# Patient Record
Sex: Male | Born: 1945 | Race: White | Hispanic: No | Marital: Married | Smoking: Never smoker
Health system: Southern US, Community
[De-identification: ages and names within clinical notes are randomized; demographics above are authoritative.]

## PROBLEM LIST (undated history)

## (undated) DIAGNOSIS — R51 Headache: Secondary | ICD-10-CM

## (undated) DIAGNOSIS — K219 Gastro-esophageal reflux disease without esophagitis: Secondary | ICD-10-CM

## (undated) DIAGNOSIS — J189 Pneumonia, unspecified organism: Secondary | ICD-10-CM

## (undated) DIAGNOSIS — R519 Headache, unspecified: Secondary | ICD-10-CM

## (undated) DIAGNOSIS — F419 Anxiety disorder, unspecified: Secondary | ICD-10-CM

## (undated) DIAGNOSIS — J45909 Unspecified asthma, uncomplicated: Secondary | ICD-10-CM

## (undated) DIAGNOSIS — I2510 Atherosclerotic heart disease of native coronary artery without angina pectoris: Secondary | ICD-10-CM

## (undated) DIAGNOSIS — E781 Pure hyperglyceridemia: Secondary | ICD-10-CM

## (undated) DIAGNOSIS — I219 Acute myocardial infarction, unspecified: Secondary | ICD-10-CM

## (undated) DIAGNOSIS — C801 Malignant (primary) neoplasm, unspecified: Secondary | ICD-10-CM

## (undated) HISTORY — PX: COLON SURGERY: SHX602

## (undated) HISTORY — PX: TONSILLECTOMY: SUR1361

## (undated) HISTORY — PX: CORONARY ANGIOPLASTY WITH STENT PLACEMENT: SHX49

## (undated) HISTORY — PX: APPENDECTOMY: SHX54

## (undated) HISTORY — PX: CHOLECYSTECTOMY: SHX55

---

## 2011-03-20 DIAGNOSIS — J4530 Mild persistent asthma, uncomplicated: Secondary | ICD-10-CM | POA: Insufficient documentation

## 2011-03-20 DIAGNOSIS — J309 Allergic rhinitis, unspecified: Secondary | ICD-10-CM | POA: Insufficient documentation

## 2011-03-20 DIAGNOSIS — J45909 Unspecified asthma, uncomplicated: Secondary | ICD-10-CM | POA: Insufficient documentation

## 2014-03-16 ENCOUNTER — Encounter: Payer: Self-pay | Admitting: Internal Medicine

## 2014-09-15 ENCOUNTER — Emergency Department (HOSPITAL_BASED_OUTPATIENT_CLINIC_OR_DEPARTMENT_OTHER)
Admission: EM | Admit: 2014-09-15 | Discharge: 2014-09-15 | Disposition: A | Discharge status: Home / Self Care | Payer: Medicare Other | Attending: Emergency Medicine | Admitting: Emergency Medicine

## 2014-09-15 ENCOUNTER — Encounter (HOSPITAL_BASED_OUTPATIENT_CLINIC_OR_DEPARTMENT_OTHER): Payer: Self-pay | Admitting: *Deleted

## 2014-09-15 ENCOUNTER — Emergency Department (HOSPITAL_BASED_OUTPATIENT_CLINIC_OR_DEPARTMENT_OTHER): Payer: Medicare Other

## 2014-09-15 DIAGNOSIS — I2510 Atherosclerotic heart disease of native coronary artery without angina pectoris: Secondary | ICD-10-CM | POA: Diagnosis not present

## 2014-09-15 DIAGNOSIS — Z8639 Personal history of other endocrine, nutritional and metabolic disease: Secondary | ICD-10-CM | POA: Diagnosis not present

## 2014-09-15 DIAGNOSIS — Z7982 Long term (current) use of aspirin: Secondary | ICD-10-CM | POA: Diagnosis not present

## 2014-09-15 DIAGNOSIS — Z79899 Other long term (current) drug therapy: Secondary | ICD-10-CM | POA: Insufficient documentation

## 2014-09-15 DIAGNOSIS — Z9861 Coronary angioplasty status: Secondary | ICD-10-CM | POA: Diagnosis not present

## 2014-09-15 DIAGNOSIS — R05 Cough: Secondary | ICD-10-CM | POA: Diagnosis present

## 2014-09-15 DIAGNOSIS — J302 Other seasonal allergic rhinitis: Secondary | ICD-10-CM

## 2014-09-15 DIAGNOSIS — J4520 Mild intermittent asthma, uncomplicated: Secondary | ICD-10-CM | POA: Insufficient documentation

## 2014-09-15 DIAGNOSIS — F419 Anxiety disorder, unspecified: Secondary | ICD-10-CM | POA: Diagnosis not present

## 2014-09-15 DIAGNOSIS — Z880 Allergy status to penicillin: Secondary | ICD-10-CM | POA: Insufficient documentation

## 2014-09-15 DIAGNOSIS — R059 Cough, unspecified: Secondary | ICD-10-CM

## 2014-09-15 HISTORY — DX: Pure hyperglyceridemia: E78.1

## 2014-09-15 HISTORY — DX: Anxiety disorder, unspecified: F41.9

## 2014-09-15 HISTORY — DX: Atherosclerotic heart disease of native coronary artery without angina pectoris: I25.10

## 2014-09-15 MED ORDER — PREDNISONE 20 MG PO TABS: 40.0000 mg | ORAL_TABLET | Freq: Every day | ORAL | Status: DC

## 2014-09-15 MED ORDER — ALBUTEROL SULFATE HFA 108 (90 BASE) MCG/ACT IN AERS
2.0000 | INHALATION_SPRAY | RESPIRATORY_TRACT | Status: DC | PRN
Start: 2014-09-15 — End: 2014-09-16
  Administered 2014-09-15: 2 via RESPIRATORY_TRACT
  Filled 2014-09-15: qty 6.7

## 2014-09-15 NOTE — ED Notes (Addendum)
EDP at [facility], pt seen by Dr. Betsey Holiday prior to RN assessment, see MD notes, pending orders. Pt sitting in chair, alert, NAD, calm, interactive, no dyspnea noted.

## 2014-09-15 NOTE — ED Provider Notes (Signed)
CSN: [id number]     Arrival date & time 09/15/14  2143 History  This chart was scribed for Isaac Greek, MD by Peyton Bottoms, ED Scribe. This patient was seen in room MH07/MH07 and the patient's care was started at 10:34 PM.   Chief Complaint  Patient presents with  . Cough   Patient is a 69 y.o. male presenting with cough. The history is provided by the patient. No language interpreter was used.  Cough Severity:  Moderate Onset quality:  Gradual Timing:  Constant Progression:  Waxing and waning Chronicity:  New Associated symptoms: shortness of breath    HPI Comments: Kellin Bartling is a 69 y.o. male with a PMHx of CAD, high triglycerides and anxiety, who presents to the Emergency Department complaining of moderate cough that began earlier today. Patient states that he took 2 albuterol treatments today. He states that the first treatment helped with some relief. He states that the albuterol medication was out dated. He states he had some "weird symptoms" after the second treatment. Patient also reports hx of tree allergies and is not sure what is causing his symptoms of cough and trouble breathing.  Past Medical History  Diagnosis Date  . Coronary artery disease   . High triglycerides   . Anxiety    Past Surgical History  Procedure Laterality Date  . Colon surgery    . Cholecystectomy    . Appendectomy    . Tonsillectomy    . Coronary angioplasty with stent placement     No family history on file. History  Substance Use Topics  . Smoking status: Never Smoker   . Smokeless tobacco: Not on file  . Alcohol Use: Yes     Comment: monthly   Review of Systems  Respiratory: Positive for cough and shortness of breath.   All other systems reviewed and are negative.  Allergies  Penicillins  Home Medications   Prior to Admission medications   Medication Sig Start Date End Date Taking? Authorizing Provider  albuterol (PROVENTIL HFA;VENTOLIN HFA) 108 (90 BASE) MCG/ACT  inhaler Inhale into the lungs every 6 (six) hours as needed for wheezing or shortness of breath.   Yes Historical Provider, MD  aspirin 81 MG tablet Take 81 mg by mouth daily.   Yes Historical Provider, MD  atorvastatin (LIPITOR) 20 MG tablet Take 20 mg by mouth daily.   Yes Historical Provider, MD  Choline Fenofibrate (TRILIPIX PO) Take by mouth.   Yes Historical Provider, MD  flunisolide (NASAREL) 29 MCG/ACT (0.025%) nasal spray Place 2 sprays into the nose 2 (two) times daily. Dose is for each nostril.   Yes Historical Provider, MD  IPRATROPIUM BROMIDE IN Inhale into the lungs.   Yes Historical Provider, MD  METOPROLOL SUCCINATE ER PO Take by mouth.   Yes Historical Provider, MD  montelukast (SINGULAIR) 10 MG tablet Take 10 mg by mouth at bedtime.   Yes Historical Provider, MD  PARoxetine HCl (PAXIL PO) Take by mouth.   Yes Historical Provider, MD   There were no vitals taken for this visit. Physical Exam  Constitutional: He is oriented to person, place, and time. He appears well-developed and well-nourished. No distress.  HENT:  Head: Normocephalic and atraumatic.  Right Ear: Hearing normal.  Left Ear: Hearing normal.  Nose: Nose normal.  Mouth/Throat: Oropharynx is clear and moist and mucous membranes are normal.  Eyes: Conjunctivae and EOM are normal. Pupils are equal, round, and reactive to light.  Neck: Normal range of motion. Neck  supple.  Cardiovascular: Regular rhythm, S1 normal and S2 normal.  Exam reveals no gallop and no friction rub.   No murmur heard. Pulmonary/Chest: Effort normal and breath sounds normal. No respiratory distress. He exhibits no tenderness.  Abdominal: Soft. Normal appearance and bowel sounds are normal. There is no hepatosplenomegaly. There is no tenderness. There is no rebound, no guarding, no tenderness at McBurney's point and negative Murphy's sign. No hernia.  Musculoskeletal: Normal range of motion.  Neurological: He is alert and oriented to person,  place, and time. He has normal strength. No cranial nerve deficit or sensory deficit. Coordination normal. GCS eye subscore is 4. GCS verbal subscore is 5. GCS motor subscore is 6.  Skin: Skin is warm, dry and intact. No rash noted. No cyanosis.  Psychiatric: He has a normal mood and affect. His speech is normal and behavior is normal. Thought content normal.  Nursing note and vitals reviewed.  ED Course  Procedures (including critical care time)  DIAGNOSTIC STUDIES:   COORDINATION OF CARE: 10:37 PM- Discussed plans to order diagnostic CXR. Pt advised of plan for treatment and pt agrees.  Labs Review Labs Reviewed - No data to display  Imaging Review No results found.   EKG Interpretation None     MDM   Final diagnoses:  None  seasonal allergies Asthma  Complains of mild throat irritation, soreness across the sinuses with cough. Patient does have a history of allergies. Patient reports using albuterol earlier, then noticed that it was out of date. He became concerned that using the expired albuterol would harm him. Examination is unremarkable. He is moving air well. Chest x-ray performed, no evidence of pneumonia. Will treat for seasonal allergies and mild asthma without any significant bronchospasm.  I personally performed the services described in this documentation, which was scribed in my presence. The recorded information has been reviewed and is accurate.    Isaac Greek, MD 09/15/14 [phone]

## 2014-09-15 NOTE — ED Notes (Signed)
C/o cough, also mentions some mild throat irritation and forehead sinus soreness, (denies: earache, facial pain, sore throat, known fever, pain, sob, nvd, dizziness or other sx), attributes sx to h/o Asthma and allergies. Has used the last of his expired inhaler. Cough somewhat productive intermitantly (scant clear and some yellow). Mentions some chills.

## 2014-09-15 NOTE — Discharge Instructions (Signed)
Asthma Asthma is a recurring condition in which the airways tighten and narrow. Asthma can make it difficult to breathe. It can cause coughing, wheezing, and shortness of breath. Asthma episodes, also called asthma attacks, range from minor to life-threatening. Asthma cannot be cured, but medicines and lifestyle changes can help control it. CAUSES Asthma is believed to be caused by inherited (genetic) and environmental factors, but its exact cause is unknown. Asthma may be triggered by allergens, lung infections, or irritants in the air. Asthma triggers are different for each person. Common triggers include:   Animal dander.  Dust mites.  Cockroaches.  Pollen from trees or grass.  Mold.  Smoke.  Air pollutants such as dust, household cleaners, hair sprays, aerosol sprays, paint fumes, strong chemicals, or strong odors.  Cold air, weather changes, and winds (which increase molds and pollens in the air).  Strong emotional expressions such as crying or laughing hard.  Stress.  Certain medicines (such as aspirin) or types of drugs (such as beta-blockers).  Sulfites in foods and drinks. Foods and drinks that may contain sulfites include dried fruit, potato chips, and sparkling grape juice.  Infections or inflammatory conditions such as the flu, a cold, or an inflammation of the nasal membranes (rhinitis).  Gastroesophageal reflux disease (GERD).  Exercise or strenuous activity. SYMPTOMS Symptoms may occur immediately after asthma is triggered or many hours later. Symptoms include:  Wheezing.  Excessive nighttime or early morning coughing.  Frequent or severe coughing with a common cold.  Chest tightness.  Shortness of breath. DIAGNOSIS  The diagnosis of asthma is made by a review of your medical history and a physical exam. Tests may also be performed. These may include:  Lung function studies. These tests show how much air you breathe in and out.  Allergy  tests.  Imaging tests such as X-rays. TREATMENT  Asthma cannot be cured, but it can usually be controlled. Treatment involves identifying and avoiding your asthma triggers. It also involves medicines. There are 2 classes of medicine used for asthma treatment:   Controller medicines. These prevent asthma symptoms from occurring. They are usually taken every day.  Reliever or rescue medicines. These quickly relieve asthma symptoms. They are used as needed and provide short-term relief. Your health care provider will help you create an asthma action plan. An asthma action plan is a written plan for managing and treating your asthma attacks. It includes a list of your asthma triggers and how they may be avoided. It also includes information on when medicines should be taken and when their dosage should be changed. An action plan may also involve the use of a device called a peak flow meter. A peak flow meter measures how well the lungs are working. It helps you monitor your condition. HOME CARE INSTRUCTIONS   Take medicines only as directed by your health care provider. Speak with your health care provider if you have questions about how or when to take the medicines.  Use a peak flow meter as directed by your health care provider. Record and keep track of readings.  Understand and use the action plan to help minimize or stop an asthma attack without needing to seek medical care.  Control your home environment in the following ways to help prevent asthma attacks:  Do not smoke. Avoid being exposed to secondhand smoke.  Change your heating and air conditioning filter regularly.  Limit your use of fireplaces and wood stoves.  Get rid of pests (such as roaches and  mice) and their droppings.  Throw away plants if you see mold on them.  Clean your floors and dust regularly. Use unscented cleaning products.  Try to have someone else vacuum for you regularly. Stay out of rooms while they are  being vacuumed and for a short while afterward. If you vacuum, use a dust mask from a hardware store, a double-layered or microfilter vacuum cleaner bag, or a vacuum cleaner with a HEPA filter.  Replace carpet with wood, tile, or vinyl flooring. Carpet can trap dander and dust.  Use allergy-proof pillows, mattress covers, and box spring covers.  Wash bed sheets and blankets every week in hot water and dry them in a dryer.  Use blankets that are made of polyester or cotton.  Clean bathrooms and kitchens with bleach. If possible, have someone repaint the walls in these rooms with mold-resistant paint. Keep out of the rooms that are being cleaned and painted.  Wash hands frequently. SEEK MEDICAL CARE IF:   You have wheezing, shortness of breath, or a cough even if taking medicine to prevent attacks.  The colored mucus you cough up (sputum) is thicker than usual.  Your sputum changes from clear or white to yellow, green, gray, or bloody.  You have any problems that may be related to the medicines you are taking (such as a rash, itching, swelling, or trouble breathing).  You are using a reliever medicine more than 2-3 times per week.  Your peak flow is still at 50-79% of your personal best after following your action plan for 1 hour.  You have a fever. SEEK IMMEDIATE MEDICAL CARE IF:   You seem to be getting worse and are unresponsive to treatment during an asthma attack.  You are short of breath even at rest.  You get short of breath when doing very little physical activity.  You have difficulty eating, drinking, or talking due to asthma symptoms.  You develop chest pain.  You develop a fast heartbeat.  You have a bluish color to your lips or fingernails.  You are light-headed, dizzy, or faint.  Your peak flow is less than 50% of your personal best. MAKE SURE YOU:   Understand these instructions.  Will watch your condition.  Will get help right away if you are not  doing well or get worse. Document Released: 06/10/2005 Document Revised: 10/25/2013 Document Reviewed: 01/07/2013 [facility] Patient Information 2015 [place], [place]. This information is not intended to replace advice given to you by your health care provider. Make sure you discuss any questions you have with your health care provider.  Hay Fever Hay fever is an allergic reaction to particles in the air. It cannot be passed from person to person. It cannot be cured, but it can be controlled. CAUSES  Hay fever is caused by something that triggers an allergic reaction (allergens). The following are examples of allergens:  Ragweed.  Feathers.  Animal dander.  Grass and tree pollens.  Cigarette smoke.  House dust.  Pollution. SYMPTOMS   Sneezing.  Runny or stuffy nose.  Tearing eyes.  Itchy eyes, nose, mouth, throat, skin, or other area.  Sore throat.  Headache.  Decreased sense of smell or taste. DIAGNOSIS Your caregiver will perform a physical exam and ask questions about the symptoms you are having.Allergy testing may be done to determine exactly what triggers your hay fever.  TREATMENT   Over-the-counter medicines may help symptoms. These include:  Antihistamines.  Decongestants. These may help with nasal congestion.  Your  caregiver may prescribe medicines if over-the-counter medicines do not work.  Some people benefit from allergy shots when other medicines are not helpful. HOME CARE INSTRUCTIONS   Avoid the allergen that is causing your symptoms, if possible.  Take all medicine as told by your caregiver. SEEK MEDICAL CARE IF:   You have severe allergy symptoms and your current medicines are not helping.  Your treatment was working at one time, but you are now experiencing symptoms.  You have sinus congestion and pressure.  You develop a fever or headache.  You have thick nasal discharge.  You have asthma and have a worsening cough and  wheezing. SEEK IMMEDIATE MEDICAL CARE IF:   You have swelling of your tongue or lips.  You have trouble breathing.  You feel lightheaded or like you are going to faint.  You have cold sweats.  You have a fever. Document Released: 06/10/2005 Document Revised: 09/02/2011 Document Reviewed: 09/05/2010 [facility] Patient Information 2015 [place], [place]. This information is not intended to replace advice given to you by your health care provider. Make sure you discuss any questions you have with your health care provider.

## 2014-09-15 NOTE — ED Notes (Signed)
Cough today. States his tree allergies are acting up. He used an outdated Albuterol inhaler with no improvement in the cough.

## 2015-02-16 DIAGNOSIS — T7800XD Anaphylactic reaction due to unspecified food, subsequent encounter: Secondary | ICD-10-CM | POA: Insufficient documentation

## 2015-02-16 DIAGNOSIS — J301 Allergic rhinitis due to pollen: Secondary | ICD-10-CM

## 2015-04-05 ENCOUNTER — Other Ambulatory Visit: Payer: Self-pay

## 2015-04-05 ENCOUNTER — Ambulatory Visit (INDEPENDENT_AMBULATORY_CARE_PROVIDER_SITE_OTHER): Payer: Medicare Other

## 2015-04-05 DIAGNOSIS — J309 Allergic rhinitis, unspecified: Secondary | ICD-10-CM | POA: Diagnosis not present

## 2015-04-05 DIAGNOSIS — J4540 Moderate persistent asthma, uncomplicated: Secondary | ICD-10-CM

## 2015-04-05 MED ORDER — MONTELUKAST SODIUM 10 MG PO TABS: 10.0000 mg | ORAL_TABLET | Freq: Every day | ORAL | Status: DC

## 2015-04-26 ENCOUNTER — Ambulatory Visit (INDEPENDENT_AMBULATORY_CARE_PROVIDER_SITE_OTHER): Payer: Medicare Other

## 2015-04-26 DIAGNOSIS — J309 Allergic rhinitis, unspecified: Secondary | ICD-10-CM | POA: Diagnosis not present

## 2015-05-15 DIAGNOSIS — I2510 Atherosclerotic heart disease of native coronary artery without angina pectoris: Secondary | ICD-10-CM | POA: Insufficient documentation

## 2015-05-15 DIAGNOSIS — E782 Mixed hyperlipidemia: Secondary | ICD-10-CM | POA: Insufficient documentation

## 2015-05-16 ENCOUNTER — Ambulatory Visit (INDEPENDENT_AMBULATORY_CARE_PROVIDER_SITE_OTHER): Payer: Medicare Other | Admitting: *Deleted

## 2015-05-16 DIAGNOSIS — J309 Allergic rhinitis, unspecified: Secondary | ICD-10-CM

## 2015-05-25 ENCOUNTER — Ambulatory Visit (INDEPENDENT_AMBULATORY_CARE_PROVIDER_SITE_OTHER): Payer: Medicare Other | Admitting: *Deleted

## 2015-05-25 DIAGNOSIS — J309 Allergic rhinitis, unspecified: Secondary | ICD-10-CM | POA: Diagnosis not present

## 2015-06-07 ENCOUNTER — Ambulatory Visit (INDEPENDENT_AMBULATORY_CARE_PROVIDER_SITE_OTHER): Payer: Medicare Other | Admitting: *Deleted

## 2015-06-07 DIAGNOSIS — J309 Allergic rhinitis, unspecified: Secondary | ICD-10-CM | POA: Diagnosis not present

## 2015-06-12 ENCOUNTER — Other Ambulatory Visit: Payer: Self-pay | Admitting: Internal Medicine

## 2015-06-14 ENCOUNTER — Ambulatory Visit (INDEPENDENT_AMBULATORY_CARE_PROVIDER_SITE_OTHER): Payer: Medicare Other | Admitting: *Deleted

## 2015-06-14 DIAGNOSIS — J309 Allergic rhinitis, unspecified: Secondary | ICD-10-CM | POA: Diagnosis not present

## 2015-06-21 ENCOUNTER — Ambulatory Visit (INDEPENDENT_AMBULATORY_CARE_PROVIDER_SITE_OTHER): Payer: Medicare Other

## 2015-06-21 DIAGNOSIS — J309 Allergic rhinitis, unspecified: Secondary | ICD-10-CM | POA: Diagnosis not present

## 2015-06-28 ENCOUNTER — Ambulatory Visit (INDEPENDENT_AMBULATORY_CARE_PROVIDER_SITE_OTHER): Payer: Medicare Other

## 2015-06-28 DIAGNOSIS — J309 Allergic rhinitis, unspecified: Secondary | ICD-10-CM | POA: Diagnosis not present

## 2015-07-18 ENCOUNTER — Ambulatory Visit (INDEPENDENT_AMBULATORY_CARE_PROVIDER_SITE_OTHER): Payer: Medicare Other

## 2015-07-18 DIAGNOSIS — J309 Allergic rhinitis, unspecified: Secondary | ICD-10-CM | POA: Diagnosis not present

## 2015-07-20 DIAGNOSIS — J301 Allergic rhinitis due to pollen: Secondary | ICD-10-CM | POA: Diagnosis not present

## 2015-07-21 DIAGNOSIS — J3089 Other allergic rhinitis: Secondary | ICD-10-CM | POA: Diagnosis not present

## 2015-08-09 ENCOUNTER — Ambulatory Visit (INDEPENDENT_AMBULATORY_CARE_PROVIDER_SITE_OTHER): Payer: Medicare Other | Admitting: *Deleted

## 2015-08-09 DIAGNOSIS — J309 Allergic rhinitis, unspecified: Secondary | ICD-10-CM

## 2015-08-30 ENCOUNTER — Ambulatory Visit (INDEPENDENT_AMBULATORY_CARE_PROVIDER_SITE_OTHER): Payer: Medicare Other | Admitting: *Deleted

## 2015-08-30 DIAGNOSIS — J309 Allergic rhinitis, unspecified: Secondary | ICD-10-CM

## 2015-09-20 ENCOUNTER — Ambulatory Visit (INDEPENDENT_AMBULATORY_CARE_PROVIDER_SITE_OTHER): Payer: Medicare Other

## 2015-09-20 DIAGNOSIS — J309 Allergic rhinitis, unspecified: Secondary | ICD-10-CM

## 2015-09-22 ENCOUNTER — Ambulatory Visit (INDEPENDENT_AMBULATORY_CARE_PROVIDER_SITE_OTHER): Payer: Medicare Other | Admitting: Internal Medicine

## 2015-09-22 ENCOUNTER — Encounter: Payer: Self-pay | Admitting: Internal Medicine

## 2015-09-22 DIAGNOSIS — Z955 Presence of coronary angioplasty implant and graft: Secondary | ICD-10-CM | POA: Insufficient documentation

## 2015-09-22 DIAGNOSIS — J4531 Mild persistent asthma with (acute) exacerbation: Secondary | ICD-10-CM | POA: Diagnosis not present

## 2015-09-22 DIAGNOSIS — J3089 Other allergic rhinitis: Secondary | ICD-10-CM

## 2015-09-22 DIAGNOSIS — T7800XD Anaphylactic reaction due to unspecified food, subsequent encounter: Secondary | ICD-10-CM

## 2015-09-22 MED ORDER — MONTELUKAST SODIUM 10 MG PO TABS
10.0000 mg | ORAL_TABLET | Freq: Every day | ORAL | Status: DC
Start: 2015-09-22 — End: 2016-09-04

## 2015-09-22 MED ORDER — CETIRIZINE HCL 10 MG PO TABS: 10.0000 mg | ORAL_TABLET | Freq: Every day | ORAL | Status: DC

## 2015-09-22 MED ORDER — ALBUTEROL SULFATE HFA 108 (90 BASE) MCG/ACT IN AERS: 2.0000 | INHALATION_SPRAY | RESPIRATORY_TRACT | Status: AC | PRN

## 2015-09-22 NOTE — Patient Instructions (Signed)
Allergic rhinitis  Currently not well controlled due to springtime allergens  Start cetirizine (Zyrtec) 10 mg daily  Continue montelukast (Singulair) 10 mg daily  Increase flunisolide to 1 spray each nostril twice a day  Continue ipratropium nasal spray as needed  Mild persistent asthma  Currently not well controlled due to springtime allergens Given Prednisone 10 mg tablets. Take 1 tablet twice a day for 4 days, then 1 tablet on day #5. Continue Singulair 10 milligrams daily Continue as needed albuterol (pro-air) He will let me know if symptoms do not improve following the prednisone  Allergy with anaphylaxis due to food  Continue avoidance of watermelon and fruits  Has EpiPen and action plan-educated on use

## 2015-09-22 NOTE — Assessment & Plan Note (Signed)
   Currently not well controlled due to springtime allergens Given Prednisone 10 mg tablets. Take 1 tablet twice a day for 4 days, then 1 tablet on day #5. Continue Singulair 10 milligrams daily Continue as needed albuterol (pro-air) He will let me know if symptoms do not improve following the prednisone

## 2015-09-22 NOTE — Progress Notes (Signed)
History of Present Illness: Isaac [name] is a 70 y.o. male presenting for follow-up  HPI Comments: Allergic rhinitis on immunotherapy: Start 04/17/2011; maintenance reached December 2013. He has not been having any shot reactions. He has been getting good benefit from his shot reactions. He has not had any interval sinus infections. This spring, he started to have increased symptoms of nasal congestion and drainage despite use of his cetirizine, flunisolide, as needed ipratropium and Singulair.  Asthma: He was doing well until the spring season when he developed persistent wheezing especially at night. He has been using albuterol frequently.  Food allergy: Watermelon causes throat swelling and fruits cause GI symptoms. He has not had any accidental ingestions.   Current Outpatient Prescriptions on File Prior to Visit  Medication Sig Dispense Refill  . aspirin 81 MG tablet Take 81 mg by mouth daily.    Marland Kitchen co-enzyme Q-10 30 MG capsule Take 30 mg by mouth 3 (three) times daily.    Marland Kitchen EPINEPHrine (EPIPEN 2-PAK) 0.3 mg/0.3 mL IJ SOAJ injection Inject into the muscle once. Expired    . flunisolide (NASALIDE) 25 MCG/ACT (0.025%) SOLN USE 2 SPRAYS IN EACH NOSTRIL ONCE A DAY FOR STUFFY NOSE OR DRAINING 75 mL 2  . IPRATROPIUM BROMIDE IN Inhale into the lungs.    Marland Kitchen METOPROLOL SUCCINATE ER PO Take by mouth. 50mg  in the morning 25mg  in the evening    . PARoxetine HCl (PAXIL PO) Take by mouth.    . cholecalciferol (VITAMIN D) 1000 UNITS tablet Take 1,000 Units by mouth daily. Reported on 09/22/2015     No current facility-administered medications on file prior to visit.    Assessment and Plan: Allergic rhinitis  Currently not well controlled due to springtime allergens  Start cetirizine (Zyrtec) 10 mg daily  Continue montelukast (Singulair) 10 mg daily  Increase flunisolide to 1 spray each nostril twice a day  Continue ipratropium nasal spray as needed  Mild persistent asthma  Currently not well  controlled due to springtime allergens Given Prednisone 10 mg tablets. Take 1 tablet twice a day for 4 days, then 1 tablet on day #5. Continue Singulair 10 milligrams daily Continue as needed albuterol (pro-air) He will let me know if symptoms do not improve following the prednisone  Allergy with anaphylaxis due to food  Continue avoidance of watermelon and fruits  Has EpiPen and action plan-educated on use    Return in about 6 months (around 03/23/2016).  Meds ordered this encounter  Medications  . ALPRAZolam (XANAX) 0.25 MG tablet    Sig: Take 0.25 mg by mouth as needed.   . Ascorbic Acid (VITAMIN C) 1000 MG tablet    Sig: Take 1,000 mg by mouth.  . Multiple Vitamin (MULTI-VITAMINS) TABS    Sig: Take by mouth.  Marland Kitchen atorvastatin (LIPITOR) 40 MG tablet    Sig:   . Choline Fenofibrate (FENOFIBRIC ACID) 135 MG CPDR    Sig:   . TOPROL XL 50 MG 24 hr tablet    Sig: Reported on 09/22/2015  . montelukast (SINGULAIR) 10 MG tablet    Sig: Take 1 tablet (10 mg total) by mouth at bedtime.    Dispense:  90 tablet    Refill:  3    Please dispense 90 day supply. For cough or wheeze  . albuterol (PROAIR HFA) 108 (90 Base) MCG/ACT inhaler    Sig: Inhale 2 puffs into the lungs every 4 (four) hours as needed for wheezing or shortness of breath.  Dispense:  3 Inhaler    Refill:  1    Dispense a 90 day supply    Diagnostics: Spirometry: FEV1 2.2 L or 71 %, FEV1/FVC  79 %.  This is consistent with moderate restriction. Full reversibility in the past.  Physical Exam: BP 124/70 mmHg  Pulse 60  Temp(Src) 97.9 F (36.6 C) (Oral)  Resp 16  Ht 5' 8.7" (1.745 m)  Wt 188 lb 4.4 oz (85.4 kg)  BMI 28.05 kg/m2   Physical Exam  Constitutional: He appears well-developed.  HENT:  Nose: Nose normal.  Mouth/Throat: Oropharynx is clear and moist.  Eyes: Conjunctivae are normal.  Cardiovascular: Normal rate, regular rhythm and normal heart sounds.   No murmur heard. Pulmonary/Chest: Effort  normal and breath sounds normal. No respiratory distress. He has no wheezes.  Abdominal: Soft. Bowel sounds are normal.  Musculoskeletal: He exhibits no edema.  Lymphadenopathy:    He has no cervical adenopathy.  Neurological: He is alert.  Skin: No rash noted.  Vitals reviewed.   Drug Allergies:  Allergies  Allergen Reactions  . Other     Fruits GI upset   . Penicillins     rash  . Watermelon [Citrullus Vulgaris] Swelling    Throat swelling    ROS: Per HPI unless specifically indicated below Review of Systems  Thank you for the opportunity to care for this patient.  Please do not hesitate to contact me with questions.

## 2015-09-22 NOTE — Assessment & Plan Note (Signed)
   Continue avoidance of watermelon and fruits  Has EpiPen and action plan-educated on use

## 2015-09-22 NOTE — Assessment & Plan Note (Addendum)
   Currently not well controlled due to springtime allergens  Start cetirizine (Zyrtec) 10 mg daily  Continue montelukast (Singulair) 10 mg daily  Increase flunisolide to 1 spray each nostril twice a day  Continue ipratropium nasal spray as needed

## 2015-09-28 ENCOUNTER — Encounter: Payer: Self-pay | Admitting: *Deleted

## 2015-10-09 ENCOUNTER — Ambulatory Visit (INDEPENDENT_AMBULATORY_CARE_PROVIDER_SITE_OTHER): Payer: Medicare Other | Admitting: Internal Medicine

## 2015-10-09 ENCOUNTER — Encounter: Payer: Self-pay | Admitting: Internal Medicine

## 2015-10-09 VITALS — BP 126/78 | HR 66 | Temp 97.7°F | Resp 16

## 2015-10-09 DIAGNOSIS — J3089 Other allergic rhinitis: Secondary | ICD-10-CM | POA: Diagnosis not present

## 2015-10-09 DIAGNOSIS — J4530 Mild persistent asthma, uncomplicated: Secondary | ICD-10-CM | POA: Diagnosis not present

## 2015-10-09 NOTE — Patient Instructions (Signed)
Mild persistent asthma  Currently not well controlled due to springtime allergens Start Qvar 80 mcg 1 puff twice a day. May increase to twice a day when sick Continue Singulair 10 mg daily and as needed albuterol (pro-air)  Allergic rhinitis  Currently well controlled   Continue Claritin 10 mg daily, Singulair as above, flunisolide 1 spray each nostril twice a day, ipratropium nasal spray as needed

## 2015-10-09 NOTE — Assessment & Plan Note (Signed)
   Currently not well controlled due to springtime allergens Start Qvar 80 mcg 1 puff twice a day. May increase to twice a day when sick Continue Singulair 10 mg daily and as needed albuterol (pro-air)

## 2015-10-09 NOTE — Progress Notes (Signed)
History of Present Illness: Isaac [name] is a 70 y.o. male presenting for follow-up  HPI Comments: Allergic rhinitis on immunotherapy: Start 04/17/2011; maintenance reached December 2013. He has not been having any shot reactions. He has been getting good benefit from his shot reactions. He has not had any interval sinus infections. He is using cetirizine, flunisolide, as needed ipratropium and Singulair.  Asthma: He was doing well until the spring season when he developed persistent wheezing especially at night. At last visit, he was treated with prednisone burst and has had improvement in his symptoms but he does not yet feel back to his baseline. Whenever he goes up stairs, he has shortness of breath and wheezing.  Food allergy: Watermelon causes throat swelling and fruits cause GI symptoms. He has not had any accidental ingestions.   Current Outpatient Prescriptions on File Prior to Visit  Medication Sig Dispense Refill  . albuterol (PROAIR HFA) 108 (90 Base) MCG/ACT inhaler Inhale 2 puffs into the lungs every 4 (four) hours as needed for wheezing or shortness of breath. 3 Inhaler 1  . ALPRAZolam (XANAX) 0.25 MG tablet Take 0.25 mg by mouth as needed.     . Ascorbic Acid (VITAMIN C) 1000 MG tablet Take 1,000 mg by mouth.    Marland Kitchen aspirin 81 MG tablet Take 81 mg by mouth daily.    Marland Kitchen atorvastatin (LIPITOR) 40 MG tablet     . cholecalciferol (VITAMIN D) 1000 UNITS tablet Take 1,000 Units by mouth daily. Reported on 09/22/2015    . Choline Fenofibrate (FENOFIBRIC ACID) 135 MG CPDR     . co-enzyme Q-10 30 MG capsule Take 30 mg by mouth 3 (three) times daily.    Marland Kitchen EPINEPHrine (EPIPEN 2-PAK) 0.3 mg/0.3 mL IJ SOAJ injection Inject into the muscle once. Expired    . flunisolide (NASALIDE) 25 MCG/ACT (0.025%) SOLN USE 2 SPRAYS IN EACH NOSTRIL ONCE A DAY FOR STUFFY NOSE OR DRAINING 75 mL 2  . IPRATROPIUM BROMIDE IN Inhale into the lungs.    Marland Kitchen METOPROLOL SUCCINATE ER PO Take by mouth. 50mg  in the morning  25mg  in the evening    . montelukast (SINGULAIR) 10 MG tablet Take 1 tablet (10 mg total) by mouth at bedtime. 90 tablet 3  . Multiple Vitamin (MULTI-VITAMINS) TABS Take by mouth.    Marland Kitchen PARoxetine HCl (PAXIL PO) Take by mouth.     No current facility-administered medications on file prior to visit.    Assessment and Plan: Mild persistent asthma  Currently not well controlled due to springtime allergens Start Qvar 80 mcg 1 puff twice a day. May increase to twice a day when sick Continue Singulair 10 mg daily and as needed albuterol (pro-air)  Allergic rhinitis  Currently well controlled   Continue Claritin 10 mg daily, Singulair as above, flunisolide 1 spray each nostril twice a day, ipratropium nasal spray as needed     Return in about 4 weeks (around 11/06/2015).  Meds ordered this encounter  Medications  . Loratadine (CLARITIN) 10 MG CAPS    Sig: Take 10 mg by mouth daily. Seems to be helping per patient    Diagnostics: Spirometry: FEV1 2.25L or 72%, FEV1/FVC  83%.  This is consistent with moderate restriction. Full reversibility in the past.  Physical Exam: BP 126/78 mmHg  Pulse 66  Temp(Src) 97.7 F (36.5 C) (Oral)  Resp 16   Physical Exam  Constitutional: He appears well-developed.  HENT:  Nose: Nose normal.  Mouth/Throat: Oropharynx is clear and moist.  Eyes: Conjunctivae are normal.  Cardiovascular: Normal rate, regular rhythm and normal heart sounds.   No murmur heard. Pulmonary/Chest: Effort normal and breath sounds normal. No respiratory distress. He has no wheezes.  Abdominal: Soft. Bowel sounds are normal.  Musculoskeletal: He exhibits no edema.  Lymphadenopathy:    He has no cervical adenopathy.  Neurological: He is alert.  Skin: No rash noted.  Vitals reviewed.   Drug Allergies:  Allergies  Allergen Reactions  . Other     Fruits GI upset   . Penicillins     rash  . Watermelon [Citrullus Vulgaris] Swelling    Throat swelling    ROS:  Per HPI unless specifically indicated below Review of Systems  Thank you for the opportunity to care for this patient.  Please do not hesitate to contact me with questions.

## 2015-10-09 NOTE — Assessment & Plan Note (Signed)
   Currently well controlled   Continue Claritin 10 mg daily, Singulair as above, flunisolide 1 spray each nostril twice a day, ipratropium nasal spray as needed

## 2015-10-11 ENCOUNTER — Ambulatory Visit (INDEPENDENT_AMBULATORY_CARE_PROVIDER_SITE_OTHER): Payer: Medicare Other

## 2015-10-11 DIAGNOSIS — J309 Allergic rhinitis, unspecified: Secondary | ICD-10-CM | POA: Diagnosis not present

## 2015-10-18 ENCOUNTER — Ambulatory Visit (INDEPENDENT_AMBULATORY_CARE_PROVIDER_SITE_OTHER): Payer: Medicare Other

## 2015-10-18 DIAGNOSIS — J309 Allergic rhinitis, unspecified: Secondary | ICD-10-CM

## 2015-10-25 ENCOUNTER — Ambulatory Visit (INDEPENDENT_AMBULATORY_CARE_PROVIDER_SITE_OTHER): Payer: Medicare Other | Admitting: *Deleted

## 2015-10-25 DIAGNOSIS — J309 Allergic rhinitis, unspecified: Secondary | ICD-10-CM | POA: Diagnosis not present

## 2015-11-01 ENCOUNTER — Ambulatory Visit (INDEPENDENT_AMBULATORY_CARE_PROVIDER_SITE_OTHER): Payer: Medicare Other

## 2015-11-01 DIAGNOSIS — J309 Allergic rhinitis, unspecified: Secondary | ICD-10-CM | POA: Diagnosis not present

## 2015-11-06 DIAGNOSIS — M7672 Peroneal tendinitis, left leg: Secondary | ICD-10-CM | POA: Insufficient documentation

## 2015-11-08 ENCOUNTER — Ambulatory Visit (INDEPENDENT_AMBULATORY_CARE_PROVIDER_SITE_OTHER): Payer: Medicare Other | Admitting: Internal Medicine

## 2015-11-08 ENCOUNTER — Encounter: Payer: Self-pay | Admitting: Internal Medicine

## 2015-11-08 VITALS — BP 126/66 | HR 62 | Temp 98.0°F | Resp 16

## 2015-11-08 DIAGNOSIS — J301 Allergic rhinitis due to pollen: Secondary | ICD-10-CM

## 2015-11-08 DIAGNOSIS — J4530 Mild persistent asthma, uncomplicated: Secondary | ICD-10-CM

## 2015-11-08 MED ORDER — BECLOMETHASONE DIPROPIONATE 80 MCG/ACT IN AERS: 2.0000 | INHALATION_SPRAY | Freq: Two times a day (BID) | RESPIRATORY_TRACT | Status: DC

## 2015-11-08 NOTE — Progress Notes (Signed)
History of Present Illness: Isaac [name] is a 70 y.o. male presenting for follow-up  HPI Comments: Allergic rhinitis on immunotherapy: Start 04/17/2011; maintenance reached December 2013. He has not been having any shot reactions. He has been getting good benefit from his shot reactions. He has not had any interval sinus infections. He is using cetirizine, flunisolide, as needed ipratropium and Singulair.  Asthma: At patient's last visit, he was started on Qvar 80 g 1 puff twice a day and feels that he is having good symptom control. He looked no longer has shortness of breath with activities of daily life. He has not required albuterol frequently.  Food allergy: Watermelon causes throat swelling and fruits cause GI symptoms. He has not had any accidental ingestions.   Assessment and Plan: Mild persistent asthma Currently well controlled  Continue Qvar 80 mcg 1-2 puff twice a day during the spring and fall. The rest of the year, if symptoms are stable he may stop this medication Continue Singulair 10 mg daily and as needed albuterol (pro-air)     Return in about 6 months (around 05/10/2016).  Current Outpatient Prescriptions on File Prior to Visit  Medication Sig Dispense Refill  . albuterol (PROAIR HFA) 108 (90 Base) MCG/ACT inhaler Inhale 2 puffs into the lungs every 4 (four) hours as needed for wheezing or shortness of breath. 3 Inhaler 1  . ALPRAZolam (XANAX) 0.25 MG tablet Take 0.25 mg by mouth as needed.     . Ascorbic Acid (VITAMIN C) 1000 MG tablet Take 1,000 mg by mouth.    Marland Kitchen aspirin 81 MG tablet Take 81 mg by mouth daily.    Marland Kitchen atorvastatin (LIPITOR) 40 MG tablet     . Choline Fenofibrate (FENOFIBRIC ACID) 135 MG CPDR     . co-enzyme Q-10 30 MG capsule Take 30 mg by mouth 3 (three) times daily.    . flunisolide (NASALIDE) 25 MCG/ACT (0.025%) SOLN USE 2 SPRAYS IN EACH NOSTRIL ONCE A DAY FOR STUFFY NOSE OR DRAINING 75 mL 2  . IPRATROPIUM BROMIDE IN Inhale into the lungs as  needed.     . Loratadine (CLARITIN) 10 MG CAPS Take 10 mg by mouth daily. Seems to be helping per patient    . montelukast (SINGULAIR) 10 MG tablet Take 1 tablet (10 mg total) by mouth at bedtime. 90 tablet 3  . Multiple Vitamin (MULTI-VITAMINS) TABS Take by mouth.    . cholecalciferol (VITAMIN D) 1000 UNITS tablet Take 1,000 Units by mouth daily. Reported on 11/08/2015    . EPINEPHrine (EPIPEN 2-PAK) 0.3 mg/0.3 mL IJ SOAJ injection Inject into the muscle once. Reported on 11/08/2015     No current facility-administered medications on file prior to visit.    Meds ordered this encounter  Medications  . TOPROL XL 50 MG 24 hr tablet    Sig: Take 50 mg by mouth daily. Takes 50mg  in the morning and 25 mg at night  . DISCONTD: PAXIL 20 MG tablet    Sig:   . PARoxetine (PAXIL) 20 MG tablet    Sig: Take 20 mg by mouth daily. Pt. Takes the name brand unable to take the generic  . NON FORMULARY    Sig:   . beclomethasone (QVAR) 80 MCG/ACT inhaler    Sig: Inhale 2 puffs into the lungs 2 (two) times daily.    Dispense:  1 Inhaler    Refill:  5    Diagnostics: Spirometry: FEV1 2.34 L or 75 %, FEV1/FVC  80 %.  This is consistent with mild restriction. Full reversibility in the past.  Physical Exam: BP 126/66 mmHg  Pulse 62  Temp(Src) 98 F (36.7 C) (Oral)  Resp 16   Physical Exam  Constitutional: He appears well-developed.  HENT:  Nose: Nose normal.  Mouth/Throat: Oropharynx is clear and moist.  Eyes: Conjunctivae are normal.  Cardiovascular: Normal rate, regular rhythm and normal heart sounds.   No murmur heard. Pulmonary/Chest: Effort normal and breath sounds normal. No respiratory distress. He has no wheezes.  Abdominal: Soft. Bowel sounds are normal.  Musculoskeletal: He exhibits no edema.  Lymphadenopathy:    He has no cervical adenopathy.  Neurological: He is alert.  Skin: No rash noted.  Vitals reviewed.   Patient Active Problem List   Diagnosis Date Noted  . 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 Allergic rhinitis 03/20/2011  . Mild persistent asthma 03/20/2011    Drug Allergies:  Allergies  Allergen Reactions  . Other     Fruits GI upset   . Penicillins     rash  . Watermelon [Citrullus Vulgaris] Swelling    Throat swelling    ROS: Per HPI unless specifically indicated below Review of Systems  Thank you for the opportunity to care for this patient.  Please do not hesitate to contact me with questions.

## 2015-11-08 NOTE — Assessment & Plan Note (Signed)
Currently well controlled  Continue Qvar 80 mcg 1-2 puff twice a day during the spring and fall. The rest of the year, if symptoms are stable he may stop this medication Continue Singulair 10 mg daily and as needed albuterol (pro-air)

## 2015-11-08 NOTE — Addendum Note (Signed)
Addended by: Katherina Right D on: 11/08/2015 10:56 AM   Modules accepted: Orders

## 2015-11-08 NOTE — Patient Instructions (Signed)
Mild persistent asthma Currently well controlled  Continue Qvar 80 mcg 1-2 puff twice a day during the spring and fall. The rest of the year, if symptoms are stable he may stop this medication Continue Singulair 10 mg daily and as needed albuterol (pro-air)

## 2015-11-10 DIAGNOSIS — K219 Gastro-esophageal reflux disease without esophagitis: Secondary | ICD-10-CM | POA: Insufficient documentation

## 2015-11-10 DIAGNOSIS — F411 Generalized anxiety disorder: Secondary | ICD-10-CM | POA: Insufficient documentation

## 2015-11-15 NOTE — Progress Notes (Signed)
This encounter was created in error - please disregard.

## 2015-11-29 ENCOUNTER — Ambulatory Visit (INDEPENDENT_AMBULATORY_CARE_PROVIDER_SITE_OTHER): Payer: Medicare Other

## 2015-11-29 DIAGNOSIS — J309 Allergic rhinitis, unspecified: Secondary | ICD-10-CM | POA: Diagnosis not present

## 2015-12-15 DIAGNOSIS — J3089 Other allergic rhinitis: Secondary | ICD-10-CM

## 2015-12-18 DIAGNOSIS — J301 Allergic rhinitis due to pollen: Secondary | ICD-10-CM | POA: Diagnosis not present

## 2015-12-20 ENCOUNTER — Ambulatory Visit (INDEPENDENT_AMBULATORY_CARE_PROVIDER_SITE_OTHER): Payer: Medicare Other

## 2015-12-20 DIAGNOSIS — J309 Allergic rhinitis, unspecified: Secondary | ICD-10-CM | POA: Diagnosis not present

## 2016-01-17 ENCOUNTER — Ambulatory Visit (INDEPENDENT_AMBULATORY_CARE_PROVIDER_SITE_OTHER): Payer: Medicare Other

## 2016-01-17 DIAGNOSIS — J309 Allergic rhinitis, unspecified: Secondary | ICD-10-CM | POA: Diagnosis not present

## 2016-02-07 ENCOUNTER — Ambulatory Visit (INDEPENDENT_AMBULATORY_CARE_PROVIDER_SITE_OTHER): Payer: Medicare Other

## 2016-02-07 DIAGNOSIS — J309 Allergic rhinitis, unspecified: Secondary | ICD-10-CM

## 2016-02-28 ENCOUNTER — Ambulatory Visit (INDEPENDENT_AMBULATORY_CARE_PROVIDER_SITE_OTHER): Payer: Medicare Other

## 2016-02-28 DIAGNOSIS — J309 Allergic rhinitis, unspecified: Secondary | ICD-10-CM

## 2016-03-20 ENCOUNTER — Ambulatory Visit (INDEPENDENT_AMBULATORY_CARE_PROVIDER_SITE_OTHER): Payer: Medicare Other

## 2016-03-20 DIAGNOSIS — J309 Allergic rhinitis, unspecified: Secondary | ICD-10-CM | POA: Diagnosis not present

## 2016-03-27 ENCOUNTER — Ambulatory Visit (INDEPENDENT_AMBULATORY_CARE_PROVIDER_SITE_OTHER): Payer: Medicare Other

## 2016-03-27 DIAGNOSIS — J309 Allergic rhinitis, unspecified: Secondary | ICD-10-CM

## 2016-03-29 ENCOUNTER — Ambulatory Visit: Payer: Medicare Other | Admitting: Allergy & Immunology

## 2016-04-03 ENCOUNTER — Ambulatory Visit (INDEPENDENT_AMBULATORY_CARE_PROVIDER_SITE_OTHER): Payer: Medicare Other

## 2016-04-03 ENCOUNTER — Ambulatory Visit: Payer: Medicare Other | Admitting: Allergy and Immunology

## 2016-04-03 DIAGNOSIS — J309 Allergic rhinitis, unspecified: Secondary | ICD-10-CM

## 2016-04-05 ENCOUNTER — Encounter: Payer: Self-pay | Admitting: Allergy & Immunology

## 2016-04-05 ENCOUNTER — Ambulatory Visit (INDEPENDENT_AMBULATORY_CARE_PROVIDER_SITE_OTHER): Payer: Medicare Other | Admitting: Allergy & Immunology

## 2016-04-05 VITALS — BP 110/60 | HR 72 | Temp 98.2°F | Resp 16

## 2016-04-05 DIAGNOSIS — T7800XD Anaphylactic reaction due to unspecified food, subsequent encounter: Secondary | ICD-10-CM | POA: Diagnosis not present

## 2016-04-05 DIAGNOSIS — J309 Allergic rhinitis, unspecified: Secondary | ICD-10-CM | POA: Diagnosis not present

## 2016-04-05 DIAGNOSIS — J4530 Mild persistent asthma, uncomplicated: Secondary | ICD-10-CM

## 2016-04-05 NOTE — Progress Notes (Signed)
FOLLOW UP  Date of Service/Encounter:  04/05/16   Assessment:   Allergic rhinitis, unspecified chronicity, unspecified seasonality, unspecified trigger  Mild persistent asthma, uncomplicated  Allergy with anaphylaxis due to food, subsequent encounter   Asthma Reportables:  Severity: mild persistent  Risk: low Control: well controlled  Seasonal Influenza Vaccine: yes     Plan/Recommendations:   1. Allergic rhinitis - Finish current bottles and then stop.  - Continue with Claritin daily. - OK to stop nasal spray (flunisolide) first. - Then in one month try stopping the Singular.   2. Mild persistent asthma, uncomplicated - Lung function looked good today, although there was some possible restriction. - Symptom wise, he is doing quite well.  - We will monitor for now and if no improvement at the next visit, will consider full pulmonary function testing. - Daily controller medication(s): none - Rescue medications: albuterol 4 puffs every 4-6 hours as needed - Changes during respiratory infections or worsening symptoms: restart Qvar 47mcg to 2 puffs once in the morning and once at night for TWO WEEKS. - Asthma control goals:  * Full participation in all desired activities (may need albuterol before activity) * Albuterol use two time or less a week on average (not counting use with activity) * Cough interfering with sleep two time or less a month * Oral steroids no more than once a year * No hospitalizations  3. Allergy with anaphylaxis due to food, subsequent encounter - Continue to avoid trigger foods (watermelon and fruits). - EpiPen up to date.  4. Return in about 6 months (around 10/04/2016).      Subjective:   Isaac [name] is a 70 y.o. male presenting today for follow up of  Chief Complaint  Patient presents with  . Follow-up  .  Isaac [name] has a history of the following: Patient Active Problem List   Diagnosis Date Noted  . Allergy with  anaphylaxis due to food 02/16/2015  . Allergic rhinitis 03/20/2011  . Mild persistent asthma 03/20/2011    History obtained from: chart review and patient.  Isaac [name] was referred by Lewie Loron     Isaac [name] is a 70 y.o. male presenting for a follow up visit. Isaac [name] was last seen in May 2017. At that time, he was doing well. He started allergy shots in October 2012 and reached maintenance in December 2013. He was continued on cetirizine, flunisolide, and when necessary ipratropium and Singulair. He was started on Qvar 80 g 1 puff twice per day around 1 year ago and reported improvement. He also has a history of watermelon allergy that causes throat swelling and fruits that cause GI distress.  Since last visit, he has done well. Isaac [name] asthma has been well controlled. He has not required rescue medication, experienced nocturnal awakenings due to lower respiratory symptoms, nor have activities of daily living been limited. He denies albuterol use. He has not been taking it all. He has been using Claritin daily. He has been using the nasal spray only as needed. He is wondering whether he needs to take the nasal spray. Overall he would just like to simplify his regimen. He does not know whether the shots have helped (would be four years in December on maintenance).   Isaac [name] is otherwise doing well. 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 He retired to [employer] after spending nearly 51 years in [place], [place]. Otherwise, there have been no changes to his past medical history, surgical history,  family history, or social history.    Review of Systems: a 14-point review of systems is pertinent for what is mentioned in HPI.  Otherwise, all other systems were negative. Constitutional: negative other than that listed in the HPI Eyes: negative other than that listed in the HPI Ears, nose, mouth, throat, and face: negative other than that listed in the HPI Respiratory: negative other  than that listed in the HPI Cardiovascular: negative other than that listed in the HPI Gastrointestinal: negative other than that listed in the HPI Genitourinary: negative other than that listed in the HPI Integument: negative other than that listed in the HPI Hematologic: negative other than that listed in the HPI Musculoskeletal: negative other than that listed in the HPI Neurological: negative other than that listed in the HPI Allergy/Immunologic: negative other than that listed in the HPI    Objective:   Blood pressure 110/60, pulse 72, temperature 98.2 F (36.8 C), temperature source Oral, resp. rate 16. There is no height or weight on file to calculate BMI.   Physical Exam:  General: Alert, interactive, in no acute distress. Cooperative with the exam.  HEENT: TMs pearly gray, turbinates edematous without discharge, post-pharynx mildly erythematous. Neck: Supple without thyromegaly. Lungs: Clear to auscultation without wheezing, rhonchi or rales. No increased work of breathing. CV: Normal S1/S2. Faint systolic ejection murmur. Capillary refill <2 seconds.  Abdomen: Nondistended, nontender. No guarding or rebound tenderness. Bowel sounds faint  Skin: Warm and dry, without lesions or rashes. Extremities:  No clubbing, cyanosis or edema. Neuro:   Grossly intact.  Diagnostic studies:  Spirometry: results abnormal (FEV1: 2.12/78%, FVC: 2.51/62%, FEV1/FVC: 84%).    Spirometry consistent with possible restrictive disease.Compared to the values obtained at the last visit, the FVC was decreased 486mL and the FEV1 was decreased 21mL. However he was having no symptoms and his physical exam was normal, therefore we will watch for now.   Allergy Studies: none    Salvatore Marvel, MD [facility]

## 2016-04-05 NOTE — Patient Instructions (Signed)
1. Allergic rhinitis - Finish current bottles and then stop.  - Continue with Claritin daily. - OK to stop nasal spray (flunisolide) first. - Then in one month try stopping the Singular.   2. Mild persistent asthma, uncomplicated - Lung function looked great today. - Daily controller medication(s): none - Rescue medications: albuterol 4 puffs every 4-6 hours as needed - Changes during respiratory infections or worsening symptoms: restart Qvar 86mcg to 2 puffs once in the morning and once at night for TWO WEEKS. - Asthma control goals:  * Full participation in all desired activities (may need albuterol before activity) * Albuterol use two time or less a week on average (not counting use with activity) * Cough interfering with sleep two time or less a month * Oral steroids no more than once a year * No hospitalizations  3. Allergy with anaphylaxis due to food, subsequent encounter - Continue to avoid trigger foods (watermelon and fruits). - EpiPen up to date.  4. Return in about 6 months (around 10/04/2016).  Please inform us of any Emergency Department visits, hospitalizations, or changes in symptoms. Call us before going to the ED for breathing or allergy symptoms since we might be able to fit you in for a sick visit. Feel free to contact us anytime with any questions, problems, or concerns.  It was a pleasure to meet you today!   Websites that have reliable patient information: 1. American Academy of Asthma, Allergy, and Immunology: www.aaaai.org 2. Food Allergy Research and Education (FARE): foodallergy.org 3. Mothers of Asthmatics: http://www.asthmacommunitynetwork.org 4. American College of Allergy, Asthma, and Immunology: www.acaai.org

## 2016-04-10 ENCOUNTER — Ambulatory Visit (INDEPENDENT_AMBULATORY_CARE_PROVIDER_SITE_OTHER): Payer: Medicare Other | Admitting: *Deleted

## 2016-04-10 DIAGNOSIS — J309 Allergic rhinitis, unspecified: Secondary | ICD-10-CM | POA: Diagnosis not present

## 2016-04-17 ENCOUNTER — Ambulatory Visit (INDEPENDENT_AMBULATORY_CARE_PROVIDER_SITE_OTHER): Payer: Medicare Other

## 2016-04-17 DIAGNOSIS — J309 Allergic rhinitis, unspecified: Secondary | ICD-10-CM

## 2016-05-08 ENCOUNTER — Ambulatory Visit (INDEPENDENT_AMBULATORY_CARE_PROVIDER_SITE_OTHER): Payer: Medicare Other | Admitting: *Deleted

## 2016-05-08 DIAGNOSIS — J309 Allergic rhinitis, unspecified: Secondary | ICD-10-CM | POA: Diagnosis not present

## 2016-05-29 ENCOUNTER — Ambulatory Visit (INDEPENDENT_AMBULATORY_CARE_PROVIDER_SITE_OTHER): Payer: Medicare Other | Admitting: *Deleted

## 2016-05-29 DIAGNOSIS — J309 Allergic rhinitis, unspecified: Secondary | ICD-10-CM | POA: Diagnosis not present

## 2016-06-03 ENCOUNTER — Other Ambulatory Visit: Payer: Self-pay | Admitting: Allergy

## 2016-06-03 ENCOUNTER — Telehealth: Payer: Self-pay | Admitting: Allergy & Immunology

## 2016-06-03 MED ORDER — IPRATROPIUM BROMIDE 0.06 % NA SOLN: 2.0000 | Freq: Three times a day (TID) | NASAL | 5 refills | Status: DC

## 2016-06-03 NOTE — Telephone Encounter (Signed)
IPRATROPIUM BROMIDE NOSE SPRAY FAXED IN.

## 2016-06-03 NOTE — Telephone Encounter (Signed)
He called saying he is a [employer] patient and said that he requested a refill on a  prescription for Ipratropium Bromide .06. He says the pharmacy says that they have to send the request to [facility] office because they don't have Dr. Ernst Bowler listed as a doctor in [facility]. He just wanted to make [place] aware so there wouldn't be confusion when it comes through. He was a former patient of Dr. Emilio Math.

## 2016-06-04 ENCOUNTER — Other Ambulatory Visit: Payer: Self-pay | Admitting: *Deleted

## 2016-06-04 MED ORDER — IPRATROPIUM BROMIDE 0.06 % NA SOLN: 2.0000 | Freq: Three times a day (TID) | NASAL | 1 refills | Status: DC

## 2016-06-04 NOTE — Telephone Encounter (Signed)
Ipratropium 0.06% was sent into pharmacy yesterday per chart to [facility].

## 2016-07-20 ENCOUNTER — Emergency Department (HOSPITAL_BASED_OUTPATIENT_CLINIC_OR_DEPARTMENT_OTHER): Payer: Medicare Other

## 2016-07-20 ENCOUNTER — Emergency Department (HOSPITAL_BASED_OUTPATIENT_CLINIC_OR_DEPARTMENT_OTHER)
Admission: EM | Admit: 2016-07-20 | Discharge: 2016-07-21 | Disposition: A | Discharge status: Home / Self Care | Payer: Medicare Other | Attending: Emergency Medicine | Admitting: Emergency Medicine

## 2016-07-20 ENCOUNTER — Encounter (HOSPITAL_BASED_OUTPATIENT_CLINIC_OR_DEPARTMENT_OTHER): Payer: Self-pay | Admitting: Emergency Medicine

## 2016-07-20 DIAGNOSIS — R6889 Other general symptoms and signs: Secondary | ICD-10-CM

## 2016-07-20 DIAGNOSIS — Z7982 Long term (current) use of aspirin: Secondary | ICD-10-CM | POA: Diagnosis not present

## 2016-07-20 DIAGNOSIS — R51 Headache: Secondary | ICD-10-CM | POA: Diagnosis not present

## 2016-07-20 DIAGNOSIS — J4530 Mild persistent asthma, uncomplicated: Secondary | ICD-10-CM | POA: Insufficient documentation

## 2016-07-20 DIAGNOSIS — R05 Cough: Secondary | ICD-10-CM | POA: Insufficient documentation

## 2016-07-20 DIAGNOSIS — I2510 Atherosclerotic heart disease of native coronary artery without angina pectoris: Secondary | ICD-10-CM | POA: Diagnosis not present

## 2016-07-20 MED ORDER — ACETAMINOPHEN 325 MG PO TABS
650.0000 mg | ORAL_TABLET | Freq: Once | ORAL | Status: AC
  Administered 2016-07-20: 650 mg via ORAL
  Filled 2016-07-20: qty 2

## 2016-07-20 NOTE — ED Triage Notes (Signed)
Pt c/o cough/congestion x approx 1 mo; reports fever (100.1) today

## 2016-07-20 NOTE — ED Provider Notes (Signed)
[facility] DEPT [facility] Provider Note   CSN: [id number] Arrival date & time: 07/20/16  1844  By signing my name below, I, Neta Mends, attest that this documentation has been prepared under the direction and in the presence of [employer], PA-C. Electronically Signed: Neta Mends, ED Scribe. 07/20/2016. 10:15 PM.    History   Chief Complaint Chief Complaint  Patient presents with  . Cough    The history is provided by the patient. No language interpreter was used.   HPI Comments:  Isaac [name] is a 71 y.o. male with PMHx of asthma who presents to the Emergency Department complaining of a waxing and waning productive cough x 1-2 months. Pt complains of associated congestion, rhinorrhea, chills, headache, sore throat, and fever of 100.1 today. He reports that yesterday his symptoms worsened. Pt reports that he went to an urgent care ~2 months ago. and we treated with cortisone for 3 days which provided some relief for his symptoms, but then in ~1 month ago his cough returned. Pt's wife states that his cough has been persistent for 3-4 weeks. Pt has taken tylenol today with mild relief. Pt has a coronary stent placed. Pt is not a smoker. Pt notes a known allergy to penicillins. Pt denies other associated symptoms.   Past Medical History:  Diagnosis Date  . Anxiety   . Coronary artery disease   . High triglycerides     Patient Active Problem List   Diagnosis Date Noted  . 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 Allergic rhinitis 03/20/2011  . Mild persistent asthma 03/20/2011    Past Surgical History:  Procedure Laterality Date  . APPENDECTOMY    . CHOLECYSTECTOMY    . COLON SURGERY    . CORONARY ANGIOPLASTY WITH STENT PLACEMENT    . TONSILLECTOMY         Home Medications    Prior to Admission medications   Medication Sig Start Date End Date Taking? Authorizing Provider  albuterol (PROAIR HFA) 108 (90 Base) MCG/ACT inhaler Inhale 2 puffs  into the lungs every 4 (four) hours as needed for wheezing or shortness of breath. 09/22/15   Leda Roys, MD  ALPRAZolam Duanne Moron) 0.25 MG tablet Take 0.25 mg by mouth as needed.     Historical Provider, MD  Ascorbic Acid (VITAMIN C) 1000 MG tablet Take 1,000 mg by mouth.    Historical Provider, MD  aspirin 81 MG tablet Take 81 mg by mouth daily.    Historical Provider, MD  atorvastatin (LIPITOR) 40 MG tablet  08/09/15   Historical Provider, MD  beclomethasone (QVAR) 80 MCG/ACT inhaler Inhale 2 puffs into the lungs 2 (two) times daily. Patient not taking: Reported on 04/05/2016 11/08/15   Leda Roys, MD  cholecalciferol (VITAMIN D) 1000 UNITS tablet Take 1,000 Units by mouth daily. Reported on 11/08/2015    Historical Provider, MD  Choline Fenofibrate (FENOFIBRIC ACID) 135 MG CPDR  08/04/15   Historical Provider, MD  co-enzyme Q-10 30 MG capsule Take 30 mg by mouth 3 (three) times daily.    Historical Provider, MD  EPINEPHrine (EPIPEN 2-PAK) 0.3 mg/0.3 mL IJ SOAJ injection Inject into the muscle once. Reported on 11/08/2015 12/15/13   Leda Roys, MD  flunisolide (NASALIDE) 25 MCG/ACT (0.025%) SOLN USE 2 SPRAYS IN EACH NOSTRIL ONCE A DAY FOR STUFFY NOSE OR DRAINING 06/12/15   Leda Roys, MD  ipratropium (ATROVENT) 0.06 % nasal spray Place 2 sprays into both nostrils 3 (three) times daily. 06/04/16  Valentina Shaggy, MD  IPRATROPIUM BROMIDE IN Inhale into the lungs as needed.     Historical Provider, MD  Loratadine (CLARITIN) 10 MG CAPS Take 10 mg by mouth daily. Seems to be helping per patient    Historical Provider, MD  montelukast (SINGULAIR) 10 MG tablet Take 1 tablet (10 mg total) by mouth at bedtime. 09/22/15   Leda Roys, MD  Multiple Vitamin (MULTI-VITAMINS) TABS Take by mouth.    Historical Provider, MD  NON FORMULARY     Historical Provider, MD  oseltamivir (TAMIFLU) 75 MG capsule Take 1 capsule (75 mg total) by mouth 2 (two) times daily. 07/21/16   Okey Regal, PA-C  PARoxetine  (PAXIL) 20 MG tablet Take 20 mg by mouth daily. Pt. Takes the name brand unable to take the generic    Historical Provider, MD  TOPROL XL 50 MG 24 hr tablet Take 50 mg by mouth daily. Takes 50mg  in the morning and 25 mg at night 10/11/15   Historical Provider, MD    Family History History reviewed. No pertinent family history.  Social History Social History  Substance Use Topics  . Smoking status: Never Smoker  . Smokeless tobacco: Never Used  . 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   Physical Exam Updated Vital Signs BP 120/65   Pulse 86   Temp 99.6 F (37.6 C) (Oral)   Resp 16   Ht 5\' 10"  (1.778 m)   Wt 81.6 kg   SpO2 98%   BMI 25.83 kg/m   Physical Exam  Constitutional: He appears well-developed and well-nourished. No distress.  HENT:  Head: Normocephalic and atraumatic.  Mouth/Throat: Oropharynx is clear and moist.  Eyes: Conjunctivae are normal.  Cardiovascular: Normal rate and regular rhythm.   Pulmonary/Chest: Effort normal and breath sounds normal. No respiratory distress. He has no wheezes. He has no rales.  Abdominal: He exhibits no distension.  Neurological: He is alert.  Skin: Skin is warm and dry.  Psychiatric: He has a normal mood and affect.  Nursing note and vitals reviewed.   ED Treatments / Results  DIAGNOSTIC STUDIES:  Oxygen Saturation is 100% on RA, normal by my interpretation.    COORDINATION OF CARE:  10:15 PM Will test for influenza. Discussed treatment plan with pt at bedside and pt agreed to plan.   Labs (all labs ordered are listed, but only abnormal results are displayed) Labs Reviewed  INFLUENZA PANEL BY PCR (TYPE A & B) - Abnormal; Notable for the following:       Result Value   Influenza A By PCR POSITIVE (*)    All other  components within normal limits    EKG  EKG Interpretation None       Radiology Dg Chest 2 View  Result Date: 07/20/2016 CLINICAL DATA:  Patient with worsening cough.  Chills and headache. EXAM: CHEST  2 VIEW COMPARISON:  Chest radiograph 09/15/2014. FINDINGS: The heart size and mediastinal contours are within normal limits. Both lungs are clear. The visualized skeletal structures are unremarkable. IMPRESSION: No active cardiopulmonary disease. Electronically Signed   By: Lovey Newcomer M.D.   On: 07/20/2016 20:32    Procedures Procedures (including critical care time)  Medications Ordered in ED Medications  acetaminophen (TYLENOL) tablet 650 mg (650 mg  Oral Given 07/20/16 2114)  oseltamivir (TAMIFLU) capsule 75 mg (75 mg Oral Given 07/21/16 0019)     Initial Impression / Assessment and Plan / ED Course  I have reviewed the triage vital signs and the nursing notes.  Pertinent labs & imaging results that were available during my care of the patient were reviewed by me and considered in my medical decision making (see chart for details).    Labs: Test for influenza.   Imaging:   Consults:   Therapeutics: Tamiflu  Discharge Meds:Tamiflu  Assessment/Plan:   71 year old male presents today with influenza-like illness. Patient having dry nonproductive cough, sore throat and headache. Patient noted have fever here today. He is otherwise well appearing in no acute distress. Chest x-ray normal. Influenza screening ordered, Tamiflu started. Patient will follow up with his lab results tomorrow morning, follow-up with primary care in 2 days, return the emergency room if he develops any new or worsening signs or symptoms. Patient care was discussed with attending physician who agreed to assessment and plan  Final Clinical Impressions(s) / ED Diagnoses   Final diagnoses:  Flu-like symptoms    New Prescriptions Discharge Medication List as of 07/21/2016 12:12 AM    I personally  performed the services described in this documentation, which was scribed in my presence. The recorded information has been reviewed and is accurate.    Okey Regal, PA-C 07/21/16 0120    Gwenyth Allegra Tegeler, MD 07/21/16 [phone]

## 2016-07-21 DIAGNOSIS — R05 Cough: Secondary | ICD-10-CM | POA: Diagnosis not present

## 2016-07-21 LAB — INFLUENZA PANEL BY PCR (TYPE A & B)
Influenza A By PCR: POSITIVE — AB
Influenza B By PCR: NEGATIVE

## 2016-07-21 MED ORDER — OSELTAMIVIR PHOSPHATE 75 MG PO CAPS
75.0000 mg | ORAL_CAPSULE | Freq: Once | ORAL | Status: AC
  Administered 2016-07-21: 75 mg via ORAL
  Filled 2016-07-21: qty 1

## 2016-07-21 MED ORDER — OSELTAMIVIR PHOSPHATE 75 MG PO CAPS: 75.0000 mg | ORAL_CAPSULE | Freq: Two times a day (BID) | ORAL | 0 refills | Status: DC

## 2016-07-21 NOTE — Discharge Instructions (Signed)
Please read attached information. If you experience any new or worsening signs or symptoms please return to the emergency room for evaluation. Please follow-up with your primary care provider or specialist as discussed. Please use medication prescribed only as directed and discontinue taking if you have any concerning signs or symptoms.   °

## 2016-08-20 NOTE — Addendum Note (Signed)
Addended by: Berniece Andreas L on: 08/20/2016 02:00 PM   Modules accepted: Orders

## 2016-09-04 ENCOUNTER — Other Ambulatory Visit: Payer: Self-pay | Admitting: Allergy

## 2016-09-04 MED ORDER — MONTELUKAST SODIUM 10 MG PO TABS: 10.0000 mg | ORAL_TABLET | Freq: Every day | ORAL | 1 refills | Status: DC

## 2016-09-09 ENCOUNTER — Telehealth (INDEPENDENT_AMBULATORY_CARE_PROVIDER_SITE_OTHER): Payer: Self-pay | Admitting: *Deleted

## 2016-09-09 NOTE — Telephone Encounter (Signed)
Pt calling for Synvisc injection

## 2016-09-09 NOTE — Telephone Encounter (Signed)
Yes, just get him to sign an ABN form.  I would put the full cost of the injection on the ABN just to cover [place]. $2160.00

## 2016-09-09 NOTE — Telephone Encounter (Signed)
Called patient to advise we would need to get prior auth.  Patient stated that he did not want to wait. Said that he would just like to go ahead with getting injection and if insurance did not pay, he would pay the difference. Is it ok to do this?

## 2016-09-09 NOTE — Telephone Encounter (Signed)
Called and left message for patient advising of below per Abigail Butts. Asked patient to call back to schedule appt for injections

## 2016-09-17 ENCOUNTER — Encounter (INDEPENDENT_AMBULATORY_CARE_PROVIDER_SITE_OTHER): Payer: Self-pay | Admitting: Orthopedic Surgery

## 2016-09-17 ENCOUNTER — Ambulatory Visit (INDEPENDENT_AMBULATORY_CARE_PROVIDER_SITE_OTHER): Payer: Medicare Other | Admitting: Orthopedic Surgery

## 2016-09-17 DIAGNOSIS — G8929 Other chronic pain: Secondary | ICD-10-CM

## 2016-09-17 DIAGNOSIS — M25561 Pain in right knee: Principal | ICD-10-CM

## 2016-09-17 DIAGNOSIS — M1711 Unilateral primary osteoarthritis, right knee: Secondary | ICD-10-CM

## 2016-09-17 MED ORDER — HYLAN G-F 20 48 MG/6ML IX SOSY
48.0000 mg | PREFILLED_SYRINGE | INTRA_ARTICULAR | Status: AC | PRN
  Administered 2016-09-17: 48 mg via INTRA_ARTICULAR

## 2016-09-17 MED ORDER — LIDOCAINE HCL 1 % IJ SOLN
5.0000 mL | INTRAMUSCULAR | Status: AC | PRN
  Administered 2016-09-17: 5 mL

## 2016-09-17 NOTE — Progress Notes (Signed)
   Procedure Note  Patient: Isaac [name]             Date of Birth: 11-02-1945           MRN: [id number]             Visit Date: 09/17/2016  Procedures: Visit Diagnoses: Chronic pain of right knee  Large Joint Inj Date/Time: 09/17/2016 9:47 AM Performed by: Meredith Pel Authorized by: Meredith Pel   Consent Given by:  Patient Site marked: the procedure site was marked   Timeout: prior to procedure the correct patient, procedure, and site was verified   Indications:  Pain, joint swelling and diagnostic evaluation Location:  Knee Site:  R knee Prep: patient was prepped and draped in usual sterile fashion   Needle Size:  18 G Needle Length:  1.5 inches Approach:  Superolateral Ultrasound Guidance: No   Fluoroscopic Guidance: No   Arthrogram: No   Medications:  5 mL lidocaine 1 %; 48 mg Hylan 48 MG/6ML Patient tolerance:  Patient tolerated the procedure well with no immediate complications

## 2016-09-25 ENCOUNTER — Ambulatory Visit (INDEPENDENT_AMBULATORY_CARE_PROVIDER_SITE_OTHER): Payer: Medicare Other | Admitting: Orthopedic Surgery

## 2016-10-04 ENCOUNTER — Encounter: Payer: Self-pay | Admitting: Allergy & Immunology

## 2016-10-04 ENCOUNTER — Ambulatory Visit (INDEPENDENT_AMBULATORY_CARE_PROVIDER_SITE_OTHER): Payer: Medicare Other | Admitting: Allergy & Immunology

## 2016-10-04 VITALS — BP 128/66 | HR 65 | Temp 98.3°F | Resp 16

## 2016-10-04 DIAGNOSIS — T7800XD Anaphylactic reaction due to unspecified food, subsequent encounter: Secondary | ICD-10-CM | POA: Diagnosis not present

## 2016-10-04 DIAGNOSIS — J3089 Other allergic rhinitis: Secondary | ICD-10-CM

## 2016-10-04 DIAGNOSIS — J4520 Mild intermittent asthma, uncomplicated: Secondary | ICD-10-CM | POA: Diagnosis not present

## 2016-10-04 NOTE — Progress Notes (Signed)
FOLLOW UP  Date of Service/Encounter:  10/04/16   Assessment:   Mild intermittent asthma without complication - well controlled off of medications  Perennial allergic rhinitis - s/p five years of allergen immunotherapy with good results  Allergy with anaphylaxis (watermelon)   Asthma Reportables:  Severity: intermittent  Risk: low Control: well controlled  Seasonal Influenza Vaccine: yes    Plan/Recommendations:   1. Allergic rhinitis - finished five years of allergy shots - Continue with Atrovent as needed. - Continue with Claritin daily as needed. - I did recommended other second generation antihistamines such as Allegra and Zyrtec if he needed stronger coverage.   2. Mild persistent asthma, uncomplicated - Lung function looked better today. - Daily controller medication(s): none - Rescue medications: albuterol 4 puffs every 4-6 hours as needed - Changes during respiratory infections or worsening symptoms: restart Qvar 12mcg to 2 puffs once in the morning and once at night for TWO WEEKS. - Asthma control goals:  * Full participation in all desired activities (may need albuterol before activity) * Albuterol use two time or less a week on average (not counting use with activity) * Cough interfering with sleep two time or less a month * Oral steroids no more than once a year * No hospitalizations  3. Allergy with anaphylaxis due to food - Continue to avoid trigger foods (watermelon). - Patient declined an EpiPen  4. Return in about 1 year (around 10/04/2017).   Subjective:   Isaac [name] is a 71 y.o. male presenting today for follow up of  Chief Complaint  Patient presents with  . Allergic Rhinitis   . Asthma    Isaac [name] has a history of the following: Patient Active Problem List   Diagnosis Date Noted  . 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 Allergic rhinitis 03/20/2011  . Mild persistent asthma 03/20/2011    History obtained from:  chart review and the patient.  [name] was referred by Lewie Loron     Isaac [name] is a 71 y.o. male presenting for a follow up visit. He was last seen in October 2017 at which time he was doing well. He did have Possible restriction on his lung function testing, we decided to monitor since his symptoms were normal. We continued him on albuterol as needed. He does have Qvar which he uses with respiratory flares. He was on allergy shots, which he concluded in December 2017. He was continued on Claritin daily. He wanted to simplify his medications even more, therefore we stopped his nasal steroid and his Singulair. He has a history of anaphylaxis to watermelon and other fruits, and his EpiPen is up-to-date.  Since last visit, he has done well. He has been on Atrovent nasal spray only as needed. He does use Claritin which he has not used since the fall. Isaac [name]'s asthma has been well controlled. He has not required rescue medication, experienced nocturnal awakenings due to lower respiratory symptoms, nor have activities of daily living been limited. He does not remember the last time that he needed albuterol. He has not needed to start his Qvar. Ragweed season and oak tree season are the worst for him. He did a good holiday season since the last visit.   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 Vaccinations are up to date including pertussis, shingles, and Pneumovax. They have grandchildren in [place] as well as [place].  Otherwise, there have been no changes to his past medical history, surgical history, family history, or social history.    Review of Systems: a 14-point review of systems is pertinent for what is mentioned in HPI.  Otherwise, all other systems were negative. Constitutional: negative other than that listed in the HPI Eyes: negative other than that listed in the HPI Ears, nose, mouth, throat, and face: negative other  than that listed in the HPI Respiratory: negative other than that listed in the HPI Cardiovascular: negative other than that listed in the HPI Gastrointestinal: negative other than that listed in the HPI Genitourinary: negative other than that listed in the HPI Integument: negative other than that listed in the HPI Hematologic: negative other than that listed in the HPI Musculoskeletal: negative other than that listed in the HPI Neurological: negative other than that listed in the HPI Allergy/Immunologic: negative other than that listed in the HPI    Objective:   Blood pressure 128/66, pulse 65, temperature 98.3 F (36.8 C), temperature source Oral, resp. rate 16, SpO2 95 %. There is no height or weight on file to calculate BMI.   Physical Exam:  General: Alert, interactive, in no acute distress. Cooperative with the exam. Friendly.  Eyes: No conjunctival injection present on the right, No conjunctival injection present on the left, PERRL bilaterally, No discharge on the right, No discharge on the left and No Horner-Trantas dots present Ears: Right TM pearly gray with normal light reflex, Left TM pearly gray with normal light reflex, Right TM intact without perforation and Left TM intact without perforation.  Nose/Throat: External nose within normal limits and septum midline, turbinates edematous without discharge, post-pharynx erythematous without cobblestoning in the posterior oropharynx. Tonsils 2+ without exudates Neck: Supple without thyromegaly. Lungs: Clear to auscultation without wheezing, rhonchi or rales. No increased work of breathing. CV: Normal S1/S2, no murmurs. Capillary refill <2 seconds.  Skin: Warm and dry, without lesions or rashes. Neuro:   Grossly intact. No focal deficits appreciated. Responsive to questions.   Diagnostic studies:  Spirometry: results normal (FEV1: 2.23/75%, FVC: 2.73/67%, FEV1/FVC: 82%).    Spirometry consistent with possible restrictive  disease. Compared to his viral obtained at the last visit, his FEV1 is increased from 2.12 L to 2.23 L. His forced vital capacity is increased from 2.51 L to 2.73 L. The FEV1 to FVC ratio is increased from 67% to 82%.  Allergy Studies: none     Salvatore Marvel, MD [facility]

## 2016-10-04 NOTE — Patient Instructions (Addendum)
1. Allergic rhinitis - finished five years of allergy shots - Continue with Atrovent as needed. - Continue with Claritin daily as needed.  2. Mild persistent asthma, uncomplicated - Lung function looked better today. - Daily controller medication(s): none - Rescue medications: albuterol 4 puffs every 4-6 hours as needed - Changes during respiratory infections or worsening symptoms: restart Qvar 30mcg to 2 puffs once in the morning and once at night for TWO WEEKS. - Asthma control goals:  * Full participation in all desired activities (may need albuterol before activity) * Albuterol use two time or less a week on average (not counting use with activity) * Cough interfering with sleep two time or less a month * Oral steroids no more than once a year * No hospitalizations  3. Allergy with anaphylaxis due to food - Continue to avoid trigger foods (watermelon). - Patient declined an EpiPen  4. Return in about 1 year (around 10/04/2017).  Please inform us of any Emergency Department visits, hospitalizations, or changes in symptoms. Call us before going to the ED for breathing or allergy symptoms since we might be able to fit you in for a sick visit. Feel free to contact us anytime with any questions, problems, or concerns.  It was a pleasure to see you again today! Enjoy the biking season!  Websites that have reliable patient information: 1. American Academy of Asthma, Allergy, and Immunology: www.aaaai.org 2. Food Allergy Research and Education (FARE): foodallergy.org 3. Mothers of Asthmatics: http://www.asthmacommunitynetwork.org 4. American College of Allergy, Asthma, and Immunology: www.acaai.org

## 2017-04-02 ENCOUNTER — Telehealth (INDEPENDENT_AMBULATORY_CARE_PROVIDER_SITE_OTHER): Payer: Self-pay | Admitting: Orthopedic Surgery

## 2017-04-11 ENCOUNTER — Encounter (INDEPENDENT_AMBULATORY_CARE_PROVIDER_SITE_OTHER): Payer: Self-pay | Admitting: Orthopedic Surgery

## 2017-04-11 ENCOUNTER — Ambulatory Visit (INDEPENDENT_AMBULATORY_CARE_PROVIDER_SITE_OTHER): Payer: Medicare Other | Admitting: Orthopedic Surgery

## 2017-04-11 DIAGNOSIS — M1711 Unilateral primary osteoarthritis, right knee: Secondary | ICD-10-CM | POA: Diagnosis not present

## 2017-04-11 MED ORDER — LIDOCAINE HCL 1 % IJ SOLN
5.0000 mL | INTRAMUSCULAR | Status: AC | PRN
  Administered 2017-04-11: 5 mL

## 2017-04-11 MED ORDER — HYLAN G-F 20 48 MG/6ML IX SOSY
48.0000 mg | PREFILLED_SYRINGE | INTRA_ARTICULAR | Status: AC | PRN
  Administered 2017-04-11: 48 mg via INTRA_ARTICULAR

## 2017-04-11 NOTE — Progress Notes (Signed)
   Procedure Note  Patient: Isaac [name]             Date of Birth: [birth year]           MRN: [id number]             Visit Date: 04/11/2017  Procedures: Visit Diagnoses: Unilateral primary osteoarthritis, right knee  Large Joint Inj Date/Time: 04/11/2017 10:15 AM Performed by: Meredith Pel Authorized by: Meredith Pel   Consent Given by:  Patient Site marked: the procedure site was marked   Timeout: prior to procedure the correct patient, procedure, and site was verified   Indications:  Pain, joint swelling and diagnostic evaluation Location:  Knee Site:  R knee Prep: patient was prepped and draped in usual sterile fashion   Needle Size:  18 G Needle Length:  1.5 inches Approach:  Superolateral Ultrasound Guidance: No   Fluoroscopic Guidance: No   Arthrogram: No   Medications:  5 mL lidocaine 1 %; 48 mg Hylan 48 MG/6ML Patient tolerance:  Patient tolerated the procedure well with no immediate complications

## 2017-04-17 ENCOUNTER — Ambulatory Visit (INDEPENDENT_AMBULATORY_CARE_PROVIDER_SITE_OTHER): Payer: Medicare Other | Admitting: Orthopedic Surgery

## 2017-04-17 ENCOUNTER — Ambulatory Visit (INDEPENDENT_AMBULATORY_CARE_PROVIDER_SITE_OTHER): Payer: Medicare Other

## 2017-04-17 ENCOUNTER — Encounter (INDEPENDENT_AMBULATORY_CARE_PROVIDER_SITE_OTHER): Payer: Self-pay | Admitting: Orthopedic Surgery

## 2017-04-17 DIAGNOSIS — G8929 Other chronic pain: Secondary | ICD-10-CM

## 2017-04-17 DIAGNOSIS — M5441 Lumbago with sciatica, right side: Secondary | ICD-10-CM | POA: Diagnosis not present

## 2017-04-17 DIAGNOSIS — M5442 Lumbago with sciatica, left side: Secondary | ICD-10-CM | POA: Diagnosis not present

## 2017-04-18 NOTE — Progress Notes (Signed)
Office Visit Note   Patient: Isaac [name]           Date of Birth: 02-22-1946           MRN: [id number] Visit Date: 04/17/2017 Requested by: Nicola Girt, [address] [place], [place] [address] PCP: Nicola Girt, DO  Subjective: Chief Complaint  Patient presents with  . Lower Back - Pain    HPI: Isaac [name] is a 64 patient with back pain since July.  Denies any history of injury.  Reports midline pain with some leg pain which comes and goes.  Denies any numbness and continuing.  We'll wake him from pain from sleep at night occasionally.  It starts in the right his stationary bike.              ROS: All systems reviewed are negative as they relate to the chief complaint within the history of present illness.  Patient denies  fevers or chills.   Assessment & Plan: Visit Diagnoses:  1. Chronic low back pain with bilateral sciatica, unspecified back pain laterality     Plan: Impression is low back pain with likely component of spondylolisthesis and facet arthritis.  I don't think it's bothering Isaac [name] very much.  We need to check him back in 6 months for repeat X rays to make sure spondylolisthesis is not progressing.  In the meantime we will put him on physical therapy.  Follow-up in 6 months  Follow-Up Instructions: Return if symptoms worsen or fail to improve.   Orders:  Orders Placed This Encounter  Procedures  . XR Lumbar Spine 2-3 Views   No orders of the defined types were placed in this encounter.     Procedures: No procedures performed   Clinical Data: No additional findings.  Objective: Vital Signs: There were no vitals taken for this visit.  Physical Exam:   Constitutional: Patient appears well-developed HEENT:  Head: Normocephalic Eyes:EOM are normal Neck: Normal range of motion Cardiovascular: Normal rate Pulmonary/chest: Effort normal Neurologic: Patient is alert Skin: Skin is warm Psychiatric: Patient has normal mood and  affect    Ortho Exam: Orthopedic exam demonstrates full active and passive range of motion of both knees and ankles.  No nerve root tension signs.  No muscle atrophy.  No paresthesias L1 S1 bilaterally with good quad strength and changed strength ankle dorsiflexion plantar flexion strength.  Mild pain with forward lateral bending but no trochanteric tenderness is present  Specialty Comments:  No specialty comments available.  Imaging: No results found.   PMFS History: Patient Active Problem List   Diagnosis Date Noted  . 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 Allergic rhinitis 03/20/2011  . Mild persistent asthma 03/20/2011   Past Medical History:  Diagnosis Date  . Anxiety   . Coronary artery disease   . High triglycerides     No family history on file.  Past Surgical History:  Procedure Laterality Date  . APPENDECTOMY    . CHOLECYSTECTOMY    . COLON SURGERY    . CORONARY ANGIOPLASTY WITH STENT PLACEMENT    . TONSILLECTOMY     Social History   Occupational History  . Not on file.   Social History Main Topics  . Smoking status: Never Smoker  . Smokeless tobacco: Never Used  . Alcohol use Yes     Comment: monthly  . Drug use: No  . Sexual activity: Not on file

## 2017-07-14 ENCOUNTER — Ambulatory Visit (INDEPENDENT_AMBULATORY_CARE_PROVIDER_SITE_OTHER): Payer: Medicare Other | Admitting: Orthopedic Surgery

## 2017-07-14 ENCOUNTER — Encounter (INDEPENDENT_AMBULATORY_CARE_PROVIDER_SITE_OTHER): Payer: Self-pay | Admitting: Orthopedic Surgery

## 2017-07-14 DIAGNOSIS — M5442 Lumbago with sciatica, left side: Secondary | ICD-10-CM | POA: Diagnosis not present

## 2017-07-14 DIAGNOSIS — M5441 Lumbago with sciatica, right side: Secondary | ICD-10-CM | POA: Diagnosis not present

## 2017-07-14 DIAGNOSIS — G8929 Other chronic pain: Secondary | ICD-10-CM | POA: Diagnosis not present

## 2017-07-14 NOTE — Progress Notes (Signed)
Office Visit Note   Patient: Isaac [name]           Date of Birth: 06-10-1946           MRN: [id number] Visit Date: 07/14/2017 Requested by: Nicola Girt, [address] [place], [place] [address] PCP: Nicola Girt, DO  Subjective: Chief Complaint  Patient presents with  . Lower Back - Follow-up, Pain  . Dizziness    HPI: Donzel is a 72 year old patient with low back pain.  He has had pain since July.  Denies any history of injury.  Reports bilateral radicular pain in both legs but his right knee hurts because of known right knee arthritis.  He has had a course of physical therapy which helped some.  He is also doing a home exercise program.              ROS: All systems reviewed are negative as they relate to the chief complaint within the history of present illness.  Patient denies  fevers or chills.   Assessment & Plan: Visit Diagnoses:  1. Chronic low back pain with bilateral sciatica, unspecified back pain laterality     Plan: Impression is low back pain and right knee arthritis.  I would like to get MRI of the lumbar spine as a preamble to obtaining epidural steroid injections.  He can follow-up with Dr. Ernestina Patches after his MRI scan so they can discuss and perform epidural steroid injections.  Follow-Up Instructions: No Follow-up on file.   Orders:  Orders Placed This Encounter  Procedures  . MR Lumbar Spine w/o contrast  . Ambulatory referral to Physical Medicine Rehab   No orders of the defined types were placed in this encounter.     Procedures: No procedures performed   Clinical Data: No additional findings.  Objective: Vital Signs: There were no vitals taken for this visit.  Physical Exam:   Constitutional: Patient appears well-developed HEENT:  Head: Normocephalic Eyes:EOM are normal Neck: Normal range of motion Cardiovascular: Normal rate Pulmonary/chest: Effort normal Neurologic: Patient is alert Skin: Skin is  warm Psychiatric: Patient has normal mood and affect    Ortho Exam: Orthopedic exam demonstrates no nerve root tension signs mild right knee pain to palpation around the patella excellent range of motion bilaterally with good ankle dorsi flexion plantar flexion quad hamstring strength.  No other masses lymphadenopathy or skin changes noted in the right knee region he is able to fully flex and touch his toes.  No paresthesias L1 S1 bilaterally.  Specialty Comments:  No specialty comments available.  Imaging: No results found.   PMFS History: Patient Active Problem List   Diagnosis Date Noted  . 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 Allergic rhinitis 03/20/2011  . Mild persistent asthma 03/20/2011   Past Medical History:  Diagnosis Date  . Anxiety   . Coronary artery disease   . High triglycerides     History reviewed. No pertinent family history.  Past Surgical History:  Procedure Laterality Date  . APPENDECTOMY    . CHOLECYSTECTOMY    . COLON SURGERY    . CORONARY ANGIOPLASTY WITH STENT PLACEMENT    . TONSILLECTOMY     Social History   Occupational History  . Not on file  Tobacco Use  . Smoking status: Never Smoker  . Smokeless tobacco: Never Used  Substance and Sexual Activity  . Alcohol use: Yes    Comment: monthly  . Drug use: No  .  Sexual activity: Not on file

## 2017-07-26 ENCOUNTER — Ambulatory Visit (HOSPITAL_BASED_OUTPATIENT_CLINIC_OR_DEPARTMENT_OTHER)
Admission: RE | Admit: 2017-07-26 | Discharge: 2017-07-26 | Disposition: A | Discharge status: Home / Self Care | Payer: Medicare Other | Source: Ambulatory Visit | Attending: Orthopedic Surgery | Admitting: Orthopedic Surgery

## 2017-07-26 DIAGNOSIS — M5442 Lumbago with sciatica, left side: Secondary | ICD-10-CM | POA: Insufficient documentation

## 2017-07-26 DIAGNOSIS — M5441 Lumbago with sciatica, right side: Secondary | ICD-10-CM | POA: Diagnosis present

## 2017-07-26 DIAGNOSIS — M5136 Other intervertebral disc degeneration, lumbar region: Secondary | ICD-10-CM | POA: Insufficient documentation

## 2017-07-26 DIAGNOSIS — G8929 Other chronic pain: Secondary | ICD-10-CM | POA: Insufficient documentation

## 2017-07-26 DIAGNOSIS — M48061 Spinal stenosis, lumbar region without neurogenic claudication: Secondary | ICD-10-CM | POA: Insufficient documentation

## 2017-08-13 ENCOUNTER — Ambulatory Visit (INDEPENDENT_AMBULATORY_CARE_PROVIDER_SITE_OTHER): Payer: Medicare Other

## 2017-08-13 ENCOUNTER — Ambulatory Visit (INDEPENDENT_AMBULATORY_CARE_PROVIDER_SITE_OTHER): Payer: Medicare Other | Admitting: Physical Medicine and Rehabilitation

## 2017-08-13 ENCOUNTER — Encounter (INDEPENDENT_AMBULATORY_CARE_PROVIDER_SITE_OTHER): Payer: Self-pay | Admitting: Physical Medicine and Rehabilitation

## 2017-08-13 VITALS — BP 145/72 | HR 57 | Temp 97.5°F

## 2017-08-13 DIAGNOSIS — M5416 Radiculopathy, lumbar region: Secondary | ICD-10-CM | POA: Diagnosis not present

## 2017-08-13 DIAGNOSIS — M48062 Spinal stenosis, lumbar region with neurogenic claudication: Secondary | ICD-10-CM

## 2017-08-13 MED ORDER — BETAMETHASONE SOD PHOS & ACET 6 (3-3) MG/ML IJ SUSP: 12.0000 mg | Freq: Once | INTRAMUSCULAR | Status: DC

## 2017-08-13 NOTE — Patient Instructions (Signed)

## 2017-08-13 NOTE — Progress Notes (Deleted)
Pt states a dull pain in lower back and right knee. Pt states pain began July 2018. Pt states he is unsure what makes it worse, it just bothers him when he first get up in the morning. Pt states laying down and resting makes pain better. +Driver, -BT, -Dye Allergies.

## 2017-08-21 NOTE — Progress Notes (Signed)
Kegan Mckeithan - 72 y.o. male MRN [id number]  Date of birth: [birth year]  Office Visit Note: Visit Date: 08/13/2017 PCP: Nicola Girt, DO Referred by: Nicola Girt, DO  Subjective: Chief Complaint  Patient presents with  . Lower Back - Pain  . Right Knee - Pain   HPI: Mr. Bohorquez is a 72 year old gentleman followed by Dr. Marlou Sa in our office for his orthopedic care.  The patient reports since July of last year he has had low back pain worse with getting up first thing in the mornings.  Worse with standing and ambulating.  He does get relief with laying down and at rest.  He reports no specific injury.  He has had no focal weakness.  Dr. Marlou Sa has been evaluating him and has tried physical therapy as well as medication trial.  MRI was obtained that shows moderate multifactorial stenosis at L3-4 as well as bilateral pars defects and small grade 1 listhesis of L5 on S1.  He is having more symptoms to the right knee which he contributes to arthritis in the knee.  He has no tingling or numbness.  We are going to try diagnostic and hopefully therapeutic bilateral L3 transforaminal epidural steroid injection.  Would consider facet joint block around the pars defect depending on his relief.    ROS Otherwise per HPI.  Assessment & Plan: Visit Diagnoses:  1. Lumbar radiculopathy   2. Spinal stenosis of lumbar region with neurogenic claudication     Plan: Findings:  Bilateral L3 transforaminal epidural steroid injection.    Meds & Orders:  Meds ordered this encounter  Medications  . betamethasone acetate-betamethasone sodium phosphate (CELESTONE) injection 12 mg    Orders Placed This Encounter  Procedures  . XR C-ARM NO REPORT  . Epidural Steroid injection    Follow-up: Return if symptoms worsen or fail to improve, for consider L4-5 facet.   Procedures: No procedures performed  Lumbosacral Transforaminal Epidural Steroid Injection - Sub-Pedicular Approach with Fluoroscopic  Guidance  Patient: Alonte Wulff      Date of Birth: April 29, 1946 MRN: [id number] PCP: Nicola Girt, DO      Visit Date: 08/13/2017   Universal Protocol:    Date/Time: 08/13/2017  Consent Given By: the patient  Position: PRONE  Additional Comments: Vital signs were monitored before and after the procedure. Patient was prepped and draped in the usual sterile fashion. The correct patient, procedure, and site was verified.   Injection Procedure Details:  Procedure Site One Meds Administered:  Meds ordered this encounter  Medications  . betamethasone acetate-betamethasone sodium phosphate (CELESTONE) injection 12 mg    Laterality: Bilateral  Location/Site:  L3-L4  Needle size: 22 G  Needle type: Spinal  Needle Placement: Transforaminal  Findings:    -Comments: Excellent flow of contrast along the nerve and into the epidural space.  Procedure Details: After squaring off the end-plates to get a true AP view, the C-arm was positioned so that an oblique view of the foramen as noted above was visualized. The target area is just inferior to the "nose of the scotty dog" or sub pedicular. The soft tissues overlying this structure were infiltrated with 2-3 ml. of 1% Lidocaine without Epinephrine.  The spinal needle was inserted toward the target using a "trajectory" view along the fluoroscope beam.  Under AP and lateral visualization, the needle was advanced so it did not puncture dura and was located close the 6 O'Clock position of the pedical in AP tracterory. Biplanar projections were  used to confirm position. Aspiration was confirmed to be negative for CSF and/or blood. A 1-2 ml. volume of Isovue-250 was injected and flow of contrast was noted at each level. Radiographs were obtained for documentation purposes.   After attaining the desired flow of contrast documented above, a 0.5 to 1.0 ml test dose of 0.25% Marcaine was injected into each respective transforaminal space.  The  patient was observed for 90 seconds post injection.  After no sensory deficits were reported, and normal lower extremity motor function was noted,   the above injectate was administered so that equal amounts of the injectate were placed at each foramen (level) into the transforaminal epidural space.   Additional Comments:  The patient tolerated the procedure well No complications occurred Dressing: Band-Aid    Post-procedure details: Patient was observed during the procedure. Post-procedure instructions were reviewed.  Patient left the clinic in stable condition.    Clinical History: MRI LUMBAR SPINE WITHOUT CONTRAST  TECHNIQUE: Multiplanar, multisequence MR imaging of the lumbar spine was performed. No intravenous contrast was administered.  COMPARISON:  Lumbar spine radiographs 04/17/2017  FINDINGS: Segmentation:  Standard.  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 Minimal left convex lumbar spine curvature.  Vertebrae: Preserved vertebral body heights without evidence of acute fracture or osseous lesion.  Conus medullaris and cauda equina: Conus extends to the T12-L1 level. Conus and cauda equina appear normal.  Paraspinal and other soft tissues: Unremarkable.  Disc levels:  L1-2: Negative.  L2-3: Mild disc desiccation. Mild disc bulging and mild facet hypertrophy result in mild bilateral lateral recess stenosis without significant spinal or neural foraminal stenosis.  L3-4: Disc desiccation and slight disc space narrowing. Circumferential disc bulging, congenitally short pedicles, and moderate facet and ligamentum flavum hypertrophy result in moderate spinal stenosis, moderate bilateral lateral recess stenosis, and mild bilateral neural foraminal stenosis. The L4 nerve roots could be affected in the lateral recesses. Interspinous bursa formation.  L4-5: Mild disc bulging, congenitally short pedicles, and  moderate facet and ligamentum flavum hypertrophy result in mild left greater than right lateral recess stenosis and mild left greater than right neural foraminal stenosis. No significant spinal stenosis pre  L5-S1: Complete disc space height loss. Anterolisthesis with disc uncovering and disc space narrowing result in mild bilateral neural foraminal stenosis without spinal stenosis.  IMPRESSION: 1. Multilevel lumbar disc and facet degeneration, most notable at L4-5 where there is moderate multifactorial spinal and lateral recess stenosis. 2. 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 3. Mild lateral recess stenosis at L2-3 and L4-5.   Electronically Signed   By: Logan Bores M.D.   On: 07/26/2017 16:14  He reports that  has never smoked. he has never used smokeless tobacco. No results for input(s): HGBA1C, LABURIC in the last 8760 hours.  Objective:  VS:  HT:    WT:   BMI:     BP:(!) 145/72  HR:(!) 57bpm  TEMP:(!) 97.5 F (36.4 C)( )  RESP:100 % Physical Exam  Musculoskeletal:  They stand and ambulate with a forward flexed lumbar spine.  There is low back pain with extension of the lumbar spine.  There is good distal strength.    Ortho Exam Imaging: No results found.  Past Medical/Family/Surgical/Social History: Medications & Allergies reviewed per EMR Patient Active Problem List   Diagnosis Date Noted  . 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 Allergic rhinitis 03/20/2011  . Mild persistent asthma  03/20/2011   Past Medical History:  Diagnosis Date  . Anxiety   . Coronary artery disease   . High triglycerides    History reviewed. No pertinent family history. Past Surgical History:  Procedure Laterality Date  . APPENDECTOMY    . CHOLECYSTECTOMY    . COLON SURGERY    . CORONARY ANGIOPLASTY WITH STENT PLACEMENT    . TONSILLECTOMY     Social History   Occupational History  . Not on file  Tobacco Use  .  Smoking status: Never Smoker  . Smokeless tobacco: Never Used  Substance and Sexual Activity  . Alcohol use: Yes    Comment: monthly  . Drug use: No  . Sexual activity: Not on file

## 2017-08-21 NOTE — Procedures (Signed)
Lumbosacral Transforaminal Epidural Steroid Injection - Sub-Pedicular Approach with Fluoroscopic Guidance  Patient: Isaac [name]      Date of Birth: Jul 12, 1945 MRN: [id number] PCP: Nicola Girt, DO      Visit Date: 08/13/2017   Universal Protocol:    Date/Time: 08/13/2017  Consent Given By: the patient  Position: PRONE  Additional Comments: Vital signs were monitored before and after the procedure. Patient was prepped and draped in the usual sterile fashion. The correct patient, procedure, and site was verified.   Injection Procedure Details:  Procedure Site One Meds Administered:  Meds ordered this encounter  Medications  . betamethasone acetate-betamethasone sodium phosphate (CELESTONE) injection 12 mg    Laterality: Bilateral  Location/Site:  L3-L4  Needle size: 22 G  Needle type: Spinal  Needle Placement: Transforaminal  Findings:    -Comments: Excellent flow of contrast along the nerve and into the epidural space.  Procedure Details: After squaring off the end-plates to get a true AP view, the C-arm was positioned so that an oblique view of the foramen as noted above was visualized. The target area is just inferior to the "nose of the scotty dog" or sub pedicular. The soft tissues overlying this structure were infiltrated with 2-3 ml. of 1% Lidocaine without Epinephrine.  The spinal needle was inserted toward the target using a "trajectory" view along the fluoroscope beam.  Under AP and lateral visualization, the needle was advanced so it did not puncture dura and was located close the 6 O'Clock position of the pedical in AP tracterory. Biplanar projections were used to confirm position. Aspiration was confirmed to be negative for CSF and/or blood. A 1-2 ml. volume of Isovue-250 was injected and flow of contrast was noted at each level. Radiographs were obtained for documentation purposes.   After attaining the desired flow of contrast documented above, a 0.5 to  1.0 ml test dose of 0.25% Marcaine was injected into each respective transforaminal space.  The patient was observed for 90 seconds post injection.  After no sensory deficits were reported, and normal lower extremity motor function was noted,   the above injectate was administered so that equal amounts of the injectate were placed at each foramen (level) into the transforaminal epidural space.   Additional Comments:  The patient tolerated the procedure well No complications occurred Dressing: Band-Aid    Post-procedure details: Patient was observed during the procedure. Post-procedure instructions were reviewed.  Patient left the clinic in stable condition.

## 2017-08-27 ENCOUNTER — Telehealth (INDEPENDENT_AMBULATORY_CARE_PROVIDER_SITE_OTHER): Payer: Self-pay | Admitting: Physical Medicine and Rehabilitation

## 2017-08-27 NOTE — Telephone Encounter (Signed)
Scheduled for 3/25 with driver and no blood thinners.

## 2017-08-27 NOTE — Telephone Encounter (Signed)
Depending on insurance would do interlam at same level versus L5-S1 facets

## 2017-09-04 ENCOUNTER — Encounter (INDEPENDENT_AMBULATORY_CARE_PROVIDER_SITE_OTHER): Payer: Self-pay | Admitting: Orthopedic Surgery

## 2017-09-04 ENCOUNTER — Ambulatory Visit (INDEPENDENT_AMBULATORY_CARE_PROVIDER_SITE_OTHER): Payer: Medicare Other

## 2017-09-04 ENCOUNTER — Ambulatory Visit (INDEPENDENT_AMBULATORY_CARE_PROVIDER_SITE_OTHER): Payer: Medicare Other | Admitting: Orthopedic Surgery

## 2017-09-04 DIAGNOSIS — M25561 Pain in right knee: Secondary | ICD-10-CM

## 2017-09-04 DIAGNOSIS — G8929 Other chronic pain: Secondary | ICD-10-CM

## 2017-09-04 DIAGNOSIS — M1711 Unilateral primary osteoarthritis, right knee: Secondary | ICD-10-CM

## 2017-09-04 NOTE — Progress Notes (Signed)
xr

## 2017-09-04 NOTE — Progress Notes (Signed)
Office Visit Note   Patient: Isaac [name]           Date of Birth: September 28, 1945           MRN: [id number] Visit Date: 09/04/2017 Requested by: Nicola Girt, [address] [place], [place] [address] PCP: Nicola Girt, DO  Subjective: Chief Complaint  Patient presents with  . Right Knee - Follow-up    HPI: Ankur is a patient with right knee pain.  He states his knee pain is getting much worse.  He does not report any rest pain but he states he can only walk about 1/10 of a mile.  He does not do stairs anymore.  Localizes the pain discretely to the medial aspect of the knee.  Had Synvisc injection in October 2018 which was not helpful.  Patient did have a stent placed last year.  Takes a baby aspirin only.  No family history or personal history of DVT or pulmonary embolism.  He is a Company secretary.  He does do biking and kayaking.              ROS: All systems reviewed are negative as they relate to the chief complaint within the history of present illness.  Patient denies  fevers or chills.   Assessment & Plan: Visit Diagnoses:  1. Chronic pain of right knee   2. Unilateral primary osteoarthritis, right knee     Plan: Impression is medial compartment arthritis in the right knee.  Plan is partial knee replacement.  Risk and benefits are discussed including but limited to infection knee stiffness incomplete pain relief as well as the potential need for more surgery.  Patient understands the risks and benefits.  We discussed total versus partial knee replacement the risk and benefits of both.  In general he is only having pain on the medial aspect of the knee.  Stress views demonstrate that he could do well with a medial knee replacement.  Patient understands risks and benefits and wishes to proceed with partial knee replacement.  ACL is intact on that right-hand side.  Follow-up with me 14 days after surgery  Follow-Up Instructions: No Follow-up on file.   Orders:    Orders Placed This Encounter  Procedures  . XR KNEE 3 VIEW RIGHT   No orders of the defined types were placed in this encounter.     Procedures: No procedures performed   Clinical Data: No additional findings.  Objective: Vital Signs: There were no vitals taken for this visit.  Physical Exam:   Constitutional: Patient appears well-developed HEENT:  Head: Normocephalic Eyes:EOM are normal Neck: Normal range of motion Cardiovascular: Normal rate Pulmonary/chest: Effort normal Neurologic: Patient is alert Skin: Skin is warm Psychiatric: Patient has normal mood and affect    Ortho Exam: Orthopedic exam demonstrates full active and passive range of motion of that right knee.  He has varus alignment.  ACL is stable.  Pedal pulses palpable.  No masses lymph adenopathy or skin changes noted in the right knee region.  There is no patellofemoral crepitus on the right or left knee.  No lateral joint line tenderness on the right or left knee.  Specialty Comments:  No specialty comments available.  Imaging: Xr Knee 3 View Right  Result Date: 09/04/2017 AP valgus stress view of the right knee and 15 degrees of flexion demonstrates correction of varus deformity and opening of the medial joint space to normal.    PMFS History: Patient Active  Problem List   Diagnosis Date Noted  . 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 Allergic rhinitis 03/20/2011  . Mild persistent asthma 03/20/2011   Past Medical History:  Diagnosis Date  . Anxiety   . Coronary artery disease   . High triglycerides     History reviewed. No pertinent family history.  Past Surgical History:  Procedure Laterality Date  . APPENDECTOMY    . CHOLECYSTECTOMY    . COLON SURGERY    . CORONARY ANGIOPLASTY WITH STENT PLACEMENT    . TONSILLECTOMY     Social History   Occupational History  . Not on file  Tobacco Use  . Smoking status: Never Smoker  . Smokeless tobacco: Never Used  Substance  and Sexual Activity  . Alcohol use: Yes    Comment: monthly  . Drug use: No  . Sexual activity: Not on file

## 2017-09-15 ENCOUNTER — Encounter (INDEPENDENT_AMBULATORY_CARE_PROVIDER_SITE_OTHER): Payer: Self-pay

## 2017-09-15 ENCOUNTER — Encounter (INDEPENDENT_AMBULATORY_CARE_PROVIDER_SITE_OTHER): Payer: Medicare Other | Admitting: Physical Medicine and Rehabilitation

## 2017-09-16 NOTE — Pre-Procedure Instructions (Signed)
Isaac [name]  09/16/2017      [facility] - [address], [address] [place] [address] Phone: [phone] Fax: [phone]  [facility] [place], [place] [address] [place] [address] Phone: [phone] Fax: [place], [address] [place] [address] Phone: [phone] Fax: [phone]    Your procedure is scheduled on April 3   Report to [facility] at 1045 A.M.  Call this number if you have problems the morning of surgery:  [phone]   Remember:  Do not eat food or drink liquids after midnight.  Take these medicines the morning of surgery with A SIP OF WATER albuterol inhaler if needed, alprazolam (Xanax), Qvar inhaler, Atrovent inhaler, atrovent nasal spray, loratadine, montelukast (Singular), omeprazole (Prilosec), Paroxetine (Paxil)  Bring your inhalers with you on the day of surgery.  Stop taking aspirin as directed by your Dr Stop taking BC's, Goody's, Herbal medications, Fish oil, Ibuprofen, Advil, Motrin, Aleve, Vitamins     Do not wear jewelry, make-up or nail polish.  Do not wear lotions, powders, or perfumes, or deodorant.  Do not shave 48 hours prior to surgery.  Men may shave face and neck.  Do not bring valuables to the hospital.  [facility] is not responsible for any belongings or valuables.  Contacts, dentures or bridgework may not be worn into surgery.  Leave your suitcase in the car.  After surgery it may be brought to your room.  For patients admitted to the hospital, discharge time will be determined by your treatment team.  Patients discharged the day of surgery will not be allowed to drive home.   Special instructions:   [facility]- Preparing For Surgery  Before surgery, you can play an important role.  Because skin is not sterile, your skin needs to be as free of germs as possible. You can reduce the number of germs on your skin by washing with CHG (chlorahexidine gluconate) Soap before surgery.  CHG is an antiseptic cleaner which kills germs and bonds with the skin to continue killing germs even after washing.  Please do not use if you have an allergy to CHG or antibacterial soaps. If your skin becomes reddened/irritated stop using the CHG.  Do not shave (including legs and underarms) for at least 48 hours prior to first CHG shower. It is OK to shave your face.  Please follow these instructions carefully.   1. Shower the NIGHT BEFORE SURGERY and the MORNING OF SURGERY with CHG.   2. If you chose to wash your hair, wash your hair first as usual with your normal shampoo.  3. After you shampoo, rinse your hair and body thoroughly to remove the shampoo.  4. Use CHG as you would any other liquid soap. You can apply CHG directly to the skin and wash gently with a scrungie or a clean washcloth.   5. Apply the CHG Soap to your body ONLY FROM THE NECK DOWN.  Do not use on open wounds or open sores. Avoid contact with your eyes, ears, mouth and genitals (private parts). Wash Face and genitals (private parts)  with your normal soap.  6. Wash thoroughly, paying special attention to the area where your surgery will be performed.  7.  Thoroughly rinse your body with warm water from the neck down.  8. DO NOT shower/wash with your normal soap after using and rinsing off the CHG Soap.  9. Pat yourself dry with a CLEAN TOWEL.  10. Wear CLEAN PAJAMAS to bed the night before surgery, wear comfortable clothes the morning of surgery  11. Place CLEAN SHEETS on your bed the night of your first shower and DO NOT SLEEP WITH PETS.    Day of Surgery: Do not apply any deodorants/lotions. Please wear clean clothes to the hospital/surgery center.      Please read over the following fact sheets that you were  given. Pain Booklet, Coughing and Deep Breathing, MRSA Information and Surgical Site Infection Prevention

## 2017-09-17 ENCOUNTER — Encounter (HOSPITAL_COMMUNITY)
Admission: RE | Admit: 2017-09-17 | Discharge: 2017-09-17 | Disposition: A | Discharge status: Home / Self Care | Payer: Medicare Other | Source: Ambulatory Visit | Attending: Orthopedic Surgery | Admitting: Orthopedic Surgery

## 2017-09-17 ENCOUNTER — Encounter (HOSPITAL_COMMUNITY): Payer: Self-pay

## 2017-09-17 ENCOUNTER — Other Ambulatory Visit: Payer: Self-pay

## 2017-09-17 DIAGNOSIS — I2510 Atherosclerotic heart disease of native coronary artery without angina pectoris: Secondary | ICD-10-CM | POA: Insufficient documentation

## 2017-09-17 DIAGNOSIS — Z01812 Encounter for preprocedural laboratory examination: Secondary | ICD-10-CM | POA: Insufficient documentation

## 2017-09-17 DIAGNOSIS — E785 Hyperlipidemia, unspecified: Secondary | ICD-10-CM | POA: Diagnosis not present

## 2017-09-17 DIAGNOSIS — Z7982 Long term (current) use of aspirin: Secondary | ICD-10-CM | POA: Insufficient documentation

## 2017-09-17 DIAGNOSIS — J45909 Unspecified asthma, uncomplicated: Secondary | ICD-10-CM | POA: Diagnosis not present

## 2017-09-17 DIAGNOSIS — K219 Gastro-esophageal reflux disease without esophagitis: Secondary | ICD-10-CM | POA: Insufficient documentation

## 2017-09-17 HISTORY — DX: Pneumonia, unspecified organism: J18.9

## 2017-09-17 HISTORY — DX: Acute myocardial infarction, unspecified: I21.9

## 2017-09-17 HISTORY — DX: Gastro-esophageal reflux disease without esophagitis: K21.9

## 2017-09-17 HISTORY — DX: Headache: R51

## 2017-09-17 HISTORY — DX: Unspecified asthma, uncomplicated: J45.909

## 2017-09-17 HISTORY — DX: Malignant (primary) neoplasm, unspecified: C80.1

## 2017-09-17 HISTORY — DX: Headache, unspecified: R51.9

## 2017-09-17 LAB — CBC
HCT: 39.3 % (ref 39.0–52.0)
Hemoglobin: 13 g/dL (ref 13.0–17.0)
MCH: 32.7 pg (ref 26.0–34.0)
MCHC: 33.1 g/dL (ref 30.0–36.0)
MCV: 99 fL (ref 78.0–100.0)
Platelets: 227 10*3/uL (ref 150–400)
RBC: 3.97 MIL/uL — AB (ref 4.22–5.81)
RDW: 13.6 % (ref 11.5–15.5)
WBC: 7 10*3/uL (ref 4.0–10.5)

## 2017-09-17 LAB — SURGICAL PCR SCREEN
MRSA, PCR: NEGATIVE
STAPHYLOCOCCUS AUREUS: NEGATIVE

## 2017-09-17 LAB — BASIC METABOLIC PANEL
ANION GAP: 14 (ref 5–15)
BUN: 34 mg/dL — ABNORMAL HIGH (ref 6–20)
CALCIUM: 9.1 mg/dL (ref 8.9–10.3)
CHLORIDE: 104 mmol/L (ref 101–111)
CO2: 20 mmol/L — ABNORMAL LOW (ref 22–32)
CREATININE: 1.23 mg/dL (ref 0.61–1.24)
GFR calc Af Amer: >60 mL/min (ref >60)
GFR calc non Af Amer: 57 mL/min — ABNORMAL LOW (ref >60)
GLUCOSE: 107 mg/dL — AB (ref 65–99)
Potassium: 4.6 mmol/L (ref 3.5–5.1)
Sodium: 138 mmol/L (ref 135–145)

## 2017-09-17 NOTE — Progress Notes (Signed)
LEFT MESSAGE FOR [facility] BILLINGS WITH DR. DEAN ASKING IF PATIENT NEEDS TO STOP ASPIRIN. PATIENT INSTRUCTED TO CALL DR. Forbes Cellar OFFICE IF HE HAS NOT HEARD ANYTHING.

## 2017-09-17 NOTE — Progress Notes (Signed)
   09/17/17 1431  OBSTRUCTIVE SLEEP APNEA  Have you ever been diagnosed with sleep apnea through a sleep study? No  Do you snore loudly (loud enough to be heard through closed doors)?  1  Do you often feel tired, fatigued, or sleepy during the daytime (such as falling asleep during driving or talking to someone)? 0  Has anyone observed you stop breathing during your sleep? 1  Do you have, or are you being treated for high blood pressure? 1  BMI more than 35 kg/m2? 0  Age > 50 (1-yes) 1  Neck circumference greater than:Male 16 inches or larger, Male 17inches or larger? 0  Male Gender (Yes=1) 1  Obstructive Sleep Apnea Score 5  Score 5 or greater  Results sent to PCP

## 2017-09-18 NOTE — Progress Notes (Addendum)
Anesthesia Chart Review:  Pt is a 72 year old male scheduled for R unicompartmental knee on 09/24/2017 with Meredith Pel, MD  - PCP is Doug Sou, DO (notes in care everywhere)  - Cardiologist is H.Gwenith Spitz, MD. Cleared for surgery at last office visit 09/12/17 with Chesley Mires, PA (notes in care everywhere)   PMH includes:  CAD (s/p stent to LAD 2010), dyslipidemia, asthma, GERD. Never smoker. BMI 28.5  Medications include: ASA 81mg , lipitor, fenofibrate, metoprolol  BP 122/65   Pulse 63   Temp (!) 36.4 C   Resp 20   Ht 5\' 9"  (1.753 m)   Wt 193 lb 6.4 oz (87.7 kg)   SpO2 98%   BMI 28.56 kg/m    Preoperative labs reviewed.   EKG 09/13/17 requested from cardiologist's office.  Report in care everywhere documents: sinus rhythm  Nuclear stress test 08/02/13 ([facility]):  1.  Negative for exercise-stress-induced ischemia. 2.  LVEF 64%.  If no changes, I anticipate pt can proceed with surgery as scheduled.   Willeen Cass, FNP-BC [facility]/Anesthesiology Phone: [phone] 09/18/2017 2:06 PM

## 2017-09-18 NOTE — Progress Notes (Signed)
Needs to stop asa 5 days before pls calla thx

## 2017-09-19 ENCOUNTER — Telehealth (INDEPENDENT_AMBULATORY_CARE_PROVIDER_SITE_OTHER): Payer: Self-pay | Admitting: Orthopedic Surgery

## 2017-09-19 ENCOUNTER — Other Ambulatory Visit (INDEPENDENT_AMBULATORY_CARE_PROVIDER_SITE_OTHER): Payer: Self-pay | Admitting: Orthopedic Surgery

## 2017-09-19 DIAGNOSIS — M1711 Unilateral primary osteoarthritis, right knee: Secondary | ICD-10-CM

## 2017-09-19 NOTE — Telephone Encounter (Signed)
IC patient back and advised he stated that he did not feel comfortable going this long without it b/c his cardiologist had told him to never go without it. I advised Dr Marlou Sa of this. He stated he is ok with patient continuing 81mg  aspirin if this was recommendation of cardiology.

## 2017-09-19 NOTE — Telephone Encounter (Signed)
Isaac [name] is scheduled for partial knee on Wed 09/24/2017.  Short Stay called and left a voicemail because the patient has a history of cardiac stent and takes a baby aspirin daily.  The patient needs instructions. Does he stop before surgery? Or Continue?  Please call the patient back and let him know.

## 2017-09-19 NOTE — Telephone Encounter (Signed)
I had called the patient yesterday and LM for him advising. Will call him again today.

## 2017-09-19 NOTE — Telephone Encounter (Signed)
y

## 2017-09-24 ENCOUNTER — Ambulatory Visit (HOSPITAL_COMMUNITY): Payer: Medicare Other | Admitting: Certified Registered Nurse Anesthetist

## 2017-09-24 ENCOUNTER — Ambulatory Visit (HOSPITAL_COMMUNITY): Payer: Medicare Other | Admitting: Emergency Medicine

## 2017-09-24 ENCOUNTER — Observation Stay (HOSPITAL_COMMUNITY)
Admission: RE | Admit: 2017-09-24 | Discharge: 2017-09-25 | Disposition: A | Discharge status: Home / Self Care | Payer: Medicare Other | Source: Ambulatory Visit | Attending: Orthopedic Surgery | Admitting: Orthopedic Surgery

## 2017-09-24 ENCOUNTER — Other Ambulatory Visit: Payer: Self-pay

## 2017-09-24 ENCOUNTER — Encounter (HOSPITAL_COMMUNITY): Payer: Self-pay | Admitting: *Deleted

## 2017-09-24 ENCOUNTER — Encounter (HOSPITAL_COMMUNITY)
Admission: RE | Disposition: A | Discharge status: Home / Self Care | Payer: Self-pay | Source: Ambulatory Visit | Attending: Orthopedic Surgery

## 2017-09-24 DIAGNOSIS — F419 Anxiety disorder, unspecified: Secondary | ICD-10-CM | POA: Insufficient documentation

## 2017-09-24 DIAGNOSIS — M1710 Unilateral primary osteoarthritis, unspecified knee: Secondary | ICD-10-CM | POA: Diagnosis present

## 2017-09-24 DIAGNOSIS — M25561 Pain in right knee: Secondary | ICD-10-CM | POA: Diagnosis not present

## 2017-09-24 DIAGNOSIS — J4530 Mild persistent asthma, uncomplicated: Secondary | ICD-10-CM | POA: Diagnosis not present

## 2017-09-24 DIAGNOSIS — Z79899 Other long term (current) drug therapy: Secondary | ICD-10-CM | POA: Diagnosis not present

## 2017-09-24 DIAGNOSIS — M1711 Unilateral primary osteoarthritis, right knee: Principal | ICD-10-CM | POA: Insufficient documentation

## 2017-09-24 DIAGNOSIS — I2510 Atherosclerotic heart disease of native coronary artery without angina pectoris: Secondary | ICD-10-CM | POA: Diagnosis not present

## 2017-09-24 DIAGNOSIS — I252 Old myocardial infarction: Secondary | ICD-10-CM | POA: Insufficient documentation

## 2017-09-24 HISTORY — PX: PARTIAL KNEE ARTHROPLASTY: SHX2174

## 2017-09-24 SURGERY — ARTHROPLASTY, KNEE, UNICOMPARTMENTAL
Anesthesia: Spinal | Site: Knee | Laterality: Right

## 2017-09-24 MED ORDER — ONDANSETRON HCL 4 MG PO TABS: 4.0000 mg | ORAL_TABLET | Freq: Four times a day (QID) | ORAL | Status: DC | PRN

## 2017-09-24 MED ORDER — ROCURONIUM BROMIDE 10 MG/ML (PF) SYRINGE
PREFILLED_SYRINGE | INTRAVENOUS | Status: AC
  Filled 2017-09-24: qty 5

## 2017-09-24 MED ORDER — PHENYLEPHRINE 40 MCG/ML (10ML) SYRINGE FOR IV PUSH (FOR BLOOD PRESSURE SUPPORT)
PREFILLED_SYRINGE | INTRAVENOUS | Status: AC
  Filled 2017-09-24: qty 10

## 2017-09-24 MED ORDER — BUPIVACAINE-EPINEPHRINE 0.25% -1:200000 IJ SOLN
INTRAMUSCULAR | Status: DC | PRN
  Administered 2017-09-24: 20 mL

## 2017-09-24 MED ORDER — HYDROMORPHONE HCL 1 MG/ML IJ SOLN: 0.2500 mg | INTRAMUSCULAR | Status: DC | PRN

## 2017-09-24 MED ORDER — VANCOMYCIN HCL IN DEXTROSE 1-5 GM/200ML-% IV SOLN
1000.0000 mg | Freq: Two times a day (BID) | INTRAVENOUS | Status: AC
  Administered 2017-09-24: 1000 mg via INTRAVENOUS
  Filled 2017-09-24: qty 200

## 2017-09-24 MED ORDER — ONDANSETRON HCL 4 MG/2ML IJ SOLN
INTRAMUSCULAR | Status: AC
  Filled 2017-09-24: qty 2

## 2017-09-24 MED ORDER — IPRATROPIUM BROMIDE 0.06 % NA SOLN
2.0000 | Freq: Three times a day (TID) | NASAL | Status: DC | PRN
  Filled 2017-09-24: qty 15

## 2017-09-24 MED ORDER — PHENOL 1.4 % MT LIQD: 1.0000 | OROMUCOSAL | Status: DC | PRN

## 2017-09-24 MED ORDER — SODIUM CHLORIDE 0.9 % IR SOLN
Status: DC | PRN
  Administered 2017-09-24: 3000 mL

## 2017-09-24 MED ORDER — PHENYLEPHRINE HCL 10 MG/ML IJ SOLN
INTRAVENOUS | Status: DC | PRN
  Administered 2017-09-24: 40 ug/min via INTRAVENOUS

## 2017-09-24 MED ORDER — BUPIVACAINE HCL (PF) 0.25 % IJ SOLN
INTRAMUSCULAR | Status: DC | PRN
  Administered 2017-09-24: 20 mL

## 2017-09-24 MED ORDER — PROPOFOL 500 MG/50ML IV EMUL
INTRAVENOUS | Status: DC | PRN
  Administered 2017-09-24 (×2): 75 ug/kg/min via INTRAVENOUS

## 2017-09-24 MED ORDER — PROPOFOL 10 MG/ML IV BOLUS
INTRAVENOUS | Status: AC
  Filled 2017-09-24: qty 20

## 2017-09-24 MED ORDER — 0.9 % SODIUM CHLORIDE (POUR BTL) OPTIME
TOPICAL | Status: DC | PRN
  Administered 2017-09-24 (×3): 1000 mL

## 2017-09-24 MED ORDER — MORPHINE SULFATE (PF) 4 MG/ML IV SOLN
INTRAVENOUS | Status: AC
  Filled 2017-09-24: qty 2

## 2017-09-24 MED ORDER — ALPRAZOLAM 0.25 MG PO TABS: 0.2500 mg | ORAL_TABLET | Freq: Every day | ORAL | Status: DC | PRN

## 2017-09-24 MED ORDER — LACTATED RINGERS IV SOLN
INTRAVENOUS | Status: DC
  Administered 2017-09-24 (×2): via INTRAVENOUS

## 2017-09-24 MED ORDER — METHOCARBAMOL 1000 MG/10ML IJ SOLN
500.0000 mg | Freq: Four times a day (QID) | INTRAVENOUS | Status: DC | PRN
  Filled 2017-09-24: qty 5

## 2017-09-24 MED ORDER — ASPIRIN EC 325 MG PO TBEC
325.0000 mg | DELAYED_RELEASE_TABLET | Freq: Every day | ORAL | Status: DC
  Administered 2017-09-25: 325 mg via ORAL
  Filled 2017-09-24: qty 1

## 2017-09-24 MED ORDER — SUGAMMADEX SODIUM 500 MG/5ML IV SOLN
INTRAVENOUS | Status: AC
  Filled 2017-09-24: qty 5

## 2017-09-24 MED ORDER — EPHEDRINE 5 MG/ML INJ
INTRAVENOUS | Status: AC
  Filled 2017-09-24: qty 10

## 2017-09-24 MED ORDER — ACETAMINOPHEN 500 MG PO TABS
500.0000 mg | ORAL_TABLET | Freq: Four times a day (QID) | ORAL | Status: DC
  Administered 2017-09-24 – 2017-09-25 (×3): 500 mg via ORAL
  Filled 2017-09-24 (×3): qty 1

## 2017-09-24 MED ORDER — FENTANYL CITRATE (PF) 100 MCG/2ML IJ SOLN
INTRAMUSCULAR | Status: AC
  Administered 2017-09-24: 50 ug
  Filled 2017-09-24: qty 2

## 2017-09-24 MED ORDER — LORATADINE 10 MG PO TABS
10.0000 mg | ORAL_TABLET | Freq: Every day | ORAL | Status: DC
  Administered 2017-09-25: 10 mg via ORAL
  Filled 2017-09-24: qty 1

## 2017-09-24 MED ORDER — PHENYLEPHRINE 40 MCG/ML (10ML) SYRINGE FOR IV PUSH (FOR BLOOD PRESSURE SUPPORT)
PREFILLED_SYRINGE | INTRAVENOUS | Status: AC
  Filled 2017-09-24: qty 20

## 2017-09-24 MED ORDER — METHOCARBAMOL 500 MG PO TABS
500.0000 mg | ORAL_TABLET | Freq: Four times a day (QID) | ORAL | Status: DC | PRN
  Administered 2017-09-25: 500 mg via ORAL
  Filled 2017-09-24: qty 1

## 2017-09-24 MED ORDER — CHLORHEXIDINE GLUCONATE 4 % EX LIQD: 60.0000 mL | Freq: Once | CUTANEOUS | Status: DC

## 2017-09-24 MED ORDER — GABAPENTIN 300 MG PO CAPS
300.0000 mg | ORAL_CAPSULE | Freq: Three times a day (TID) | ORAL | Status: DC
  Administered 2017-09-24 – 2017-09-25 (×3): 300 mg via ORAL
  Filled 2017-09-24 (×3): qty 1

## 2017-09-24 MED ORDER — ONDANSETRON HCL 4 MG/2ML IJ SOLN: 4.0000 mg | Freq: Four times a day (QID) | INTRAMUSCULAR | Status: DC | PRN

## 2017-09-24 MED ORDER — CLONIDINE HCL (ANALGESIA) 100 MCG/ML EP SOLN
EPIDURAL | Status: DC | PRN
  Administered 2017-09-24: 1 mL

## 2017-09-24 MED ORDER — METOPROLOL SUCCINATE ER 25 MG PO TB24
50.0000 mg | ORAL_TABLET | ORAL | Status: AC
  Administered 2017-09-24: 50 mg via ORAL
  Filled 2017-09-24: qty 2

## 2017-09-24 MED ORDER — MENTHOL 3 MG MT LOZG: 1.0000 | LOZENGE | OROMUCOSAL | Status: DC | PRN

## 2017-09-24 MED ORDER — LIDOCAINE HCL (CARDIAC) 20 MG/ML IV SOLN
INTRAVENOUS | Status: AC
  Filled 2017-09-24: qty 5

## 2017-09-24 MED ORDER — BUPIVACAINE-EPINEPHRINE (PF) 0.25% -1:200000 IJ SOLN
INTRAMUSCULAR | Status: AC
  Filled 2017-09-24: qty 30

## 2017-09-24 MED ORDER — BUPIVACAINE-EPINEPHRINE (PF) 0.5% -1:200000 IJ SOLN
INTRAMUSCULAR | Status: AC
  Filled 2017-09-24: qty 30

## 2017-09-24 MED ORDER — MORPHINE SULFATE (PF) 4 MG/ML IV SOLN
INTRAVENOUS | Status: AC
  Filled 2017-09-24: qty 1

## 2017-09-24 MED ORDER — SODIUM CHLORIDE 0.9 % IJ SOLN
INTRAMUSCULAR | Status: DC | PRN
  Administered 2017-09-24: 20 mL

## 2017-09-24 MED ORDER — ATORVASTATIN CALCIUM 20 MG PO TABS
20.0000 mg | ORAL_TABLET | Freq: Every evening | ORAL | Status: DC
  Administered 2017-09-24: 20 mg via ORAL
  Filled 2017-09-24: qty 1

## 2017-09-24 MED ORDER — PROMETHAZINE HCL 25 MG/ML IJ SOLN: 6.2500 mg | INTRAMUSCULAR | Status: DC | PRN

## 2017-09-24 MED ORDER — METOCLOPRAMIDE HCL 5 MG/ML IJ SOLN: 5.0000 mg | Freq: Three times a day (TID) | INTRAMUSCULAR | Status: DC | PRN

## 2017-09-24 MED ORDER — BUPIVACAINE HCL (PF) 0.25 % IJ SOLN
INTRAMUSCULAR | Status: AC
  Filled 2017-09-24: qty 30

## 2017-09-24 MED ORDER — MIDAZOLAM HCL 2 MG/2ML IJ SOLN: 0.5000 mg | Freq: Once | INTRAMUSCULAR | Status: DC | PRN

## 2017-09-24 MED ORDER — METOPROLOL SUCCINATE ER 25 MG PO TB24: 25.0000 mg | ORAL_TABLET | Freq: Two times a day (BID) | ORAL | Status: DC

## 2017-09-24 MED ORDER — HYDROCODONE-ACETAMINOPHEN 7.5-325 MG PO TABS
1.0000 | ORAL_TABLET | ORAL | Status: DC | PRN
  Administered 2017-09-25: 2 via ORAL
  Filled 2017-09-24: qty 2

## 2017-09-24 MED ORDER — BUPIVACAINE LIPOSOME 1.3 % IJ SUSP
20.0000 mL | INTRAMUSCULAR | Status: AC
  Administered 2017-09-24: 20 mL
  Filled 2017-09-24: qty 20

## 2017-09-24 MED ORDER — PAROXETINE HCL 20 MG PO TABS
20.0000 mg | ORAL_TABLET | Freq: Every day | ORAL | Status: DC
  Administered 2017-09-25: 20 mg via ORAL
  Filled 2017-09-24 (×2): qty 1

## 2017-09-24 MED ORDER — MIDAZOLAM HCL 2 MG/2ML IJ SOLN
INTRAMUSCULAR | Status: AC
  Filled 2017-09-24: qty 2

## 2017-09-24 MED ORDER — MORPHINE SULFATE (PF) 4 MG/ML IV SOLN
INTRAVENOUS | Status: DC | PRN
  Administered 2017-09-24: 8 mg via INTRAMUSCULAR

## 2017-09-24 MED ORDER — DOCUSATE SODIUM 100 MG PO CAPS
100.0000 mg | ORAL_CAPSULE | Freq: Two times a day (BID) | ORAL | Status: DC
Start: 2017-09-24 — End: 2017-09-25
  Filled 2017-09-24: qty 1

## 2017-09-24 MED ORDER — BUPIVACAINE IN DEXTROSE 0.75-8.25 % IT SOLN
INTRATHECAL | Status: DC | PRN
  Administered 2017-09-24: 15 mg via INTRATHECAL

## 2017-09-24 MED ORDER — FENTANYL CITRATE (PF) 250 MCG/5ML IJ SOLN
INTRAMUSCULAR | Status: AC
  Filled 2017-09-24: qty 5

## 2017-09-24 MED ORDER — ROPIVACAINE HCL 7.5 MG/ML IJ SOLN
INTRAMUSCULAR | Status: DC | PRN
  Administered 2017-09-24: 20 mL via PERINEURAL

## 2017-09-24 MED ORDER — METOPROLOL SUCCINATE ER 25 MG PO TB24
25.0000 mg | ORAL_TABLET | Freq: Every evening | ORAL | Status: DC
  Administered 2017-09-24: 25 mg via ORAL
  Filled 2017-09-24 (×2): qty 1

## 2017-09-24 MED ORDER — CLINDAMYCIN PHOSPHATE 900 MG/50ML IV SOLN
900.0000 mg | INTRAVENOUS | Status: AC
  Administered 2017-09-24: 900 mg via INTRAVENOUS
  Filled 2017-09-24: qty 50

## 2017-09-24 MED ORDER — LIDOCAINE HCL (CARDIAC) 20 MG/ML IV SOLN
INTRAVENOUS | Status: DC | PRN
  Administered 2017-09-24: 100 mg via INTRAVENOUS

## 2017-09-24 MED ORDER — EPHEDRINE SULFATE 50 MG/ML IJ SOLN
INTRAMUSCULAR | Status: DC | PRN
  Administered 2017-09-24: 10 mg via INTRAVENOUS
  Administered 2017-09-24: 5 mg via INTRAVENOUS

## 2017-09-24 MED ORDER — PHENYLEPHRINE HCL 10 MG/ML IJ SOLN
INTRAMUSCULAR | Status: AC
  Filled 2017-09-24: qty 1

## 2017-09-24 MED ORDER — ALBUTEROL SULFATE (2.5 MG/3ML) 0.083% IN NEBU: 3.0000 mL | INHALATION_SOLUTION | RESPIRATORY_TRACT | Status: DC | PRN

## 2017-09-24 MED ORDER — METOCLOPRAMIDE HCL 5 MG PO TABS: 5.0000 mg | ORAL_TABLET | Freq: Three times a day (TID) | ORAL | Status: DC | PRN

## 2017-09-24 MED ORDER — PROPOFOL 10 MG/ML IV BOLUS
INTRAVENOUS | Status: DC | PRN
  Administered 2017-09-24: 30 mg via INTRAVENOUS

## 2017-09-24 MED ORDER — FENOFIBRATE 54 MG PO TABS
54.0000 mg | ORAL_TABLET | Freq: Every day | ORAL | Status: DC
  Administered 2017-09-25: 54 mg via ORAL
  Filled 2017-09-24: qty 1

## 2017-09-24 MED ORDER — MEPERIDINE HCL 50 MG/ML IJ SOLN: 6.2500 mg | INTRAMUSCULAR | Status: DC | PRN

## 2017-09-24 MED ORDER — METOPROLOL SUCCINATE ER 50 MG PO TB24
50.0000 mg | ORAL_TABLET | Freq: Every day | ORAL | Status: DC
  Administered 2017-09-25: 50 mg via ORAL
  Filled 2017-09-24: qty 1

## 2017-09-24 MED ORDER — SODIUM CHLORIDE 0.9 % IV SOLN
2000.0000 mg | INTRAVENOUS | Status: AC
  Administered 2017-09-24: 2000 mg via TOPICAL
  Filled 2017-09-24: qty 20

## 2017-09-24 MED ORDER — CLONIDINE HCL (ANALGESIA) 100 MCG/ML EP SOLN
150.0000 ug | Freq: Once | EPIDURAL | Status: DC
  Filled 2017-09-24: qty 10

## 2017-09-24 MED ORDER — MORPHINE SULFATE (PF) 2 MG/ML IV SOLN: 0.5000 mg | INTRAVENOUS | Status: DC | PRN

## 2017-09-24 MED ORDER — MIDAZOLAM HCL 2 MG/2ML IJ SOLN
INTRAMUSCULAR | Status: AC
  Administered 2017-09-24: 1 mg
  Filled 2017-09-24: qty 2

## 2017-09-24 SURGICAL SUPPLY — 77 items
ALCOHOL 70% 16 OZ (MISCELLANEOUS) ×3 IMPLANT
BANDAGE ACE 4X5 VEL STRL LF (GAUZE/BANDAGES/DRESSINGS) ×3 IMPLANT
BANDAGE ESMARK 6X9 LF (GAUZE/BANDAGES/DRESSINGS) ×1 IMPLANT
BEARING ANATOMIC 5 (Orthopedic Implant) ×2 IMPLANT
BEARING ANATOMIC 5MM (Orthopedic Implant) ×1 IMPLANT
BIT DRILL QUICK REL 1/8 2PK SL (DRILL) ×1 IMPLANT
BNDG COHESIVE 6X5 TAN STRL LF (GAUZE/BANDAGES/DRESSINGS) ×3 IMPLANT
BNDG ELASTIC 6X15 VLCR STRL LF (GAUZE/BANDAGES/DRESSINGS) ×3 IMPLANT
BNDG ESMARK 6X9 LF (GAUZE/BANDAGES/DRESSINGS) ×3
BOWL SMART MIX CTS (DISPOSABLE) ×3 IMPLANT
CEMENT BONE R 1X40 (Cement) ×3 IMPLANT
CEMENT BONE SIMPLEX SPEEDSET (Cement) IMPLANT
CLOSURE WOUND 1/2 X4 (GAUZE/BANDAGES/DRESSINGS) ×1
COVER SURGICAL LIGHT HANDLE (MISCELLANEOUS) ×3 IMPLANT
CUFF TOURNIQUET SINGLE 34IN LL (TOURNIQUET CUFF) ×3 IMPLANT
DECANTER SPIKE VIAL GLASS SM (MISCELLANEOUS) ×9 IMPLANT
DRAPE IMP U-DRAPE 54X76 (DRAPES) ×3 IMPLANT
DRAPE INCISE IOBAN 66X45 STRL (DRAPES) IMPLANT
DRAPE ORTHO SPLIT 77X108 STRL (DRAPES) ×6
DRAPE SURG ORHT 6 SPLT 77X108 (DRAPES) ×3 IMPLANT
DRAPE U-SHAPE 47X51 STRL (DRAPES) ×3 IMPLANT
DRILL QUICK RELEASE 1/8 INCH (DRILL) ×2
DRSG AQUACEL AG ADV 3.5X 6 (GAUZE/BANDAGES/DRESSINGS) ×3 IMPLANT
DRSG AQUACEL AG ADV 3.5X10 (GAUZE/BANDAGES/DRESSINGS) ×3 IMPLANT
DRSG PAD ABDOMINAL 8X10 ST (GAUZE/BANDAGES/DRESSINGS) ×6 IMPLANT
DURAPREP 26ML APPLICATOR (WOUND CARE) ×3 IMPLANT
ELECT REM PT RETURN 9FT ADLT (ELECTROSURGICAL) ×3
ELECTRODE REM PT RTRN 9FT ADLT (ELECTROSURGICAL) ×1 IMPLANT
FACESHIELD WRAPAROUND (MASK) ×3 IMPLANT
GAUZE SPONGE 4X4 12PLY STRL (GAUZE/BANDAGES/DRESSINGS) ×3 IMPLANT
GAUZE XEROFORM 5X9 LF (GAUZE/BANDAGES/DRESSINGS) ×3 IMPLANT
GLOVE BIOGEL PI IND STRL 8 (GLOVE) ×1 IMPLANT
GLOVE BIOGEL PI INDICATOR 8 (GLOVE) ×2
GLOVE SURG ORTHO 8.0 STRL STRW (GLOVE) ×3 IMPLANT
GLOVE SURG SS PI 7.0 STRL IVOR (GLOVE) ×9 IMPLANT
GOWN STRL REUS W/ TWL LRG LVL3 (GOWN DISPOSABLE) ×2 IMPLANT
GOWN STRL REUS W/ TWL XL LVL3 (GOWN DISPOSABLE) ×1 IMPLANT
GOWN STRL REUS W/TWL LRG LVL3 (GOWN DISPOSABLE) ×4
GOWN STRL REUS W/TWL XL LVL3 (GOWN DISPOSABLE) ×2
HANDPIECE INTERPULSE COAX TIP (DISPOSABLE) ×2
HOOD PEEL AWAY FACE SHEILD DIS (HOOD) ×6 IMPLANT
IMMOBILIZER KNEE 20 (SOFTGOODS) IMPLANT
IMMOBILIZER KNEE 22 UNIV (SOFTGOODS) ×3 IMPLANT
IMMOBILIZER KNEE 24 THIGH 36 (MISCELLANEOUS) IMPLANT
IMMOBILIZER KNEE 24 UNIV (MISCELLANEOUS)
KIT BASIN OR (CUSTOM PROCEDURE TRAY) ×3 IMPLANT
KIT TURNOVER KIT B (KITS) ×3 IMPLANT
MANIFOLD NEPTUNE II (INSTRUMENTS) ×3 IMPLANT
NEEDLE 18GX1X1/2 (RX/OR ONLY) (NEEDLE) ×3 IMPLANT
NEEDLE HYPO 22GX1.5 SAFETY (NEEDLE) ×6 IMPLANT
NEEDLE SPNL 18GX3.5 QUINCKE PK (NEEDLE) ×3 IMPLANT
NS IRRIG 1000ML POUR BTL (IV SOLUTION) ×3 IMPLANT
PACK BLADE SAW RECIP 70 3 PT (BLADE) ×3 IMPLANT
PACK TOTAL JOINT (CUSTOM PROCEDURE TRAY) ×3 IMPLANT
PACK UNIVERSAL I (CUSTOM PROCEDURE TRAY) ×3 IMPLANT
PAD ARMBOARD 7.5X6 YLW CONV (MISCELLANEOUS) ×6 IMPLANT
PAD CAST 4YDX4 CTTN HI CHSV (CAST SUPPLIES) ×1 IMPLANT
PADDING CAST COTTON 4X4 STRL (CAST SUPPLIES) ×2
PADDING CAST COTTON 6X4 STRL (CAST SUPPLIES) ×6 IMPLANT
PEG TWIN FEM CEMENTED MED (Knees) ×3 IMPLANT
SET HNDPC FAN SPRY TIP SCT (DISPOSABLE) ×1 IMPLANT
SOL PREP POV-IOD 4OZ 10% (MISCELLANEOUS) ×3 IMPLANT
STAPLER VISISTAT 35W (STAPLE) ×3 IMPLANT
STRIP CLOSURE SKIN 1/2X4 (GAUZE/BANDAGES/DRESSINGS) ×2 IMPLANT
SUCTION FRAZIER HANDLE 10FR (MISCELLANEOUS) ×2
SUCTION TUBE FRAZIER 10FR DISP (MISCELLANEOUS) ×1 IMPLANT
SUT MNCRL AB 3-0 PS2 18 (SUTURE) ×3 IMPLANT
SUT VIC AB 0 CTB1 27 (SUTURE) ×9 IMPLANT
SUT VIC AB 1 CT1 27 (SUTURE) ×10
SUT VIC AB 1 CT1 27XBRD ANBCTR (SUTURE) ×5 IMPLANT
SUT VIC AB 2-0 CT1 27 (SUTURE) ×4
SUT VIC AB 2-0 CT1 TAPERPNT 27 (SUTURE) ×2 IMPLANT
SYR 30ML LL (SYRINGE) ×3 IMPLANT
SYR CONTROL 10ML LL (SYRINGE) ×6 IMPLANT
SYR TB 1ML LUER SLIP (SYRINGE) ×3 IMPLANT
TOWEL OR 17X26 10 PK STRL BLUE (TOWEL DISPOSABLE) ×3 IMPLANT
TRAY TIBIAL OXFORD SZ C RT (Joint) ×3 IMPLANT

## 2017-09-24 NOTE — Anesthesia Procedure Notes (Signed)
Spinal  Patient location during procedure: OR End time: 09/24/2017 12:50 PM Staffing Anesthesiologist: Annye Asa, MD Performed: anesthesiologist  Preanesthetic Checklist Completed: patient identified, site marked, surgical consent, pre-op evaluation, timeout performed, IV checked, risks and benefits discussed and monitors and equipment checked Spinal Block Patient position: sitting Prep: site prepped and draped and DuraPrep Patient monitoring: blood pressure, continuous pulse ox, cardiac monitor and heart rate Approach: midline Location: L3-4 Injection technique: single-shot Needle Needle type: Pencan  Needle gauge: 24 G Needle length: 9 cm Additional Notes Pt identified in Operating room.  Monitors applied. Working IV access confirmed. Sterile prep, drape lumbar spine.  1% lido local L 3,4.  #22ga Pencan into clear CSF L 3,4.  15mg  0.75% Bupivacaine with dextrose injected with asp CSF beginning and end of injection.  Patient asymptomatic, VSS, no heme aspirated, tolerated well.  Jenita Seashore, MD

## 2017-09-24 NOTE — Anesthesia Postprocedure Evaluation (Signed)
Anesthesia Post Note  Patient: Isaac [name]  Procedure(s) Performed: RIGHT UNICOMPARTMENTAL KNEE (Right Knee)     Patient location during evaluation: PACU Anesthesia Type: Spinal and Regional Level of consciousness: awake and alert, oriented and patient cooperative Pain management: pain level controlled Vital Signs Assessment: post-procedure vital signs reviewed and stable Respiratory status: spontaneous breathing, nonlabored ventilation and respiratory function stable Cardiovascular status: blood pressure returned to baseline Postop Assessment: spinal receding, patient able to bend at knees and no apparent nausea or vomiting Anesthetic complications: no    Last Vitals:  Vitals:   09/24/17 1607 09/24/17 1615  BP: (!) 123/58   Pulse: 71 70  Resp: 10 (!) 9  Temp: 36.6 C   SpO2: 96% 97%    Last Pain:  Vitals:   09/24/17 1607  TempSrc:   PainSc: 0-No pain                 Dominie Benedick,E. Rael Yo

## 2017-09-24 NOTE — Transfer of Care (Signed)
Immediate Anesthesia Transfer of Care Note  Patient: Isaac [name]  Procedure(s) Performed: RIGHT UNICOMPARTMENTAL KNEE (Right Knee)  Patient Location: PACU  Anesthesia Type:MAC and Spinal  Level of Consciousness: awake, alert  and oriented  Airway & Oxygen Therapy: Patient Spontanous Breathing and Patient connected to nasal cannula oxygen  Post-op Assessment: Report given to RN and Post -op Vital signs reviewed and stable  Post vital signs: Reviewed and stable  Last Vitals:  Vitals Value Taken Time  BP 123/58 09/24/2017  4:08 PM  Temp    Pulse 69 09/24/2017  4:13 PM  Resp 17 09/24/2017  4:13 PM  SpO2 99 % 09/24/2017  4:13 PM  Vitals shown include unvalidated device data.  Last Pain:  Vitals:   09/24/17 1044  TempSrc:   PainSc: 0-No pain         Complications: No apparent anesthesia complications

## 2017-09-24 NOTE — Anesthesia Preprocedure Evaluation (Addendum)
Anesthesia Evaluation  Patient identified by MRN, date of birth, ID band Patient awake    Reviewed: Allergy & Precautions, NPO status , Patient's Chart, lab work & pertinent test results  History of Anesthesia Complications Negative for: history of anesthetic complications  Airway Mallampati: II  TM Distance: >3 FB Neck ROM: Full    Dental  (+) Caps, Dental Advisory Given   Pulmonary asthma ,    breath sounds clear to auscultation       Cardiovascular hypertension, Pt. on home beta blockers and Pt. on medications (-) angina+ CAD, + Past MI (2010, preserved LVF) and + Cardiac Stents (LAD 2010)   Rhythm:Regular Rate:Normal     Neuro/Psych Anxiety negative neurological ROS     GI/Hepatic Neg liver ROS, GERD  Controlled and Medicated,  Endo/Other  negative endocrine ROS  Renal/GU negative Renal ROS     Musculoskeletal   Abdominal   Peds  Hematology negative hematology ROS (+)   Anesthesia Other Findings   Reproductive/Obstetrics                            Anesthesia Physical Anesthesia Plan  ASA: III  Anesthesia Plan: Spinal   Post-op Pain Management:  Regional for Post-op pain   Induction:   PONV Risk Score and Plan: 1 and Ondansetron  Airway Management Planned: Natural Airway and Simple Face Mask  Additional Equipment:   Intra-op Plan:   Post-operative Plan:   Informed Consent: I have reviewed the patients History and Physical, chart, labs and discussed the procedure including the risks, benefits and alternatives for the proposed anesthesia with the patient or authorized representative who has indicated his/her understanding and acceptance.   Dental advisory given  Plan Discussed with: CRNA and Surgeon  Anesthesia Plan Comments: (Plan routine monitors, SAB with adductor canal block for post op analgesia)        Anesthesia Quick Evaluation

## 2017-09-24 NOTE — Progress Notes (Signed)
RN moved pt metoprolol to 2200 because his BP was 103/60, also pt refused paxil because he stated he took the medication this morning so does not need.

## 2017-09-24 NOTE — Op Note (Signed)
09/24/2017  4:04 PM  PATIENT:  Isaac [name]  72 y.o. male  PRE-OPERATIVE DIAGNOSIS:  Right Knee Osteoarthritis  POST-OPERATIVE DIAGNOSIS:  Right Knee Osteoarthritis  PROCEDURE:  Procedure(s): RIGHT UNICOMPARTMENTAL KNEE  SURGEON:  Surgeon(s): Meredith Pel, MD  ASSISTANT: Ky Barban rnfa  ANESTHESIA:   spinal  EBL: 150 ml    Total I/O In: 1000 [I.V.:1000] Out: 1050 [Urine:900; Blood:150]  BLOOD ADMINISTERED: none  DRAINS: none   LOCAL MEDICATIONS USED:  Marcaine mso4 clonidine exparel  SPECIMEN:  No Specimen  COUNTS:  YES  TOURNIQUET:   Total Tourniquet Time Documented: Thigh (Right) - 118 minutes Total: Thigh (Right) - 118 minutes   DICTATION: .Other Dictation: Dictation Number [id number]  PLAN OF CARE: Admit to inpatient   PATIENT DISPOSITION:  PACU - hemodynamically stable

## 2017-09-24 NOTE — Anesthesia Procedure Notes (Signed)
Anesthesia Regional Block: Adductor canal block   Pre-Anesthetic Checklist: ,, timeout performed, Correct Patient, Correct Site, Correct Laterality, Correct Procedure, Correct Position, site marked, Risks and benefits discussed,  Surgical consent,  Pre-op evaluation,  At surgeon's request and post-op pain management  Laterality: Right and Lower  Prep: chloraprep       Needles:  Injection technique: Single-shot  Needle Type: Echogenic Needle     Needle Length: 9cm  Needle Gauge: 21     Additional Needles:   Procedures:,,,, ultrasound used (permanent image in chart),,,,  Narrative:  Start time: 09/24/2017 12:25 PM End time: 09/24/2017 12:31 PM Injection made incrementally with aspirations every 5 mL.  Performed by: Personally  Anesthesiologist: Annye Asa, MD  Additional Notes: Pt identified in Holding room.  Monitors applied. Working IV access confirmed. Sterile prep, drape R thigh.  #21ga ECHOgenic needle into adductor canal with US guidance.  20cc 0.75% Ropivacaine injected incrementally after negative test dose.  Patient asymptomatic, VSS, no heme aspirated, tolerated well.  Jenita Seashore, MD

## 2017-09-24 NOTE — Progress Notes (Signed)
Orthopedic Tech Progress Note Patient Details:  Isaac [name] 21-Jul-1945 [id number]  CPM Right Knee CPM Right Knee: On Right Knee Flexion (Degrees): 40 Right Knee Extension (Degrees): 0  Post Interventions Patient Tolerated: Well Instructions Provided: Care of device Ortho Devices Type of Ortho Device: Bone foam zero knee, CPM padding Ortho Device/Splint Location: footsie roll and ohf Ortho Device/Splint Interventions: Ordered, Application, Adjustment   Post Interventions Patient Tolerated: Well Instructions Provided: Care of device   Braulio Bosch 09/24/2017, 5:06 PM

## 2017-09-24 NOTE — Op Note (Signed)
NAME:  KI, CORBO NO.:  [id number]  MEDICAL RECORD NO.:  [id number]  LOCATION:  [facility]                         FACILITY:  [facility]  PHYSICIAN:  Anderson Malta, M.D.    DATE OF BIRTH:  23-Sep-1945  DATE OF PROCEDURE:  09/24/2017 DATE OF DISCHARGE:                              OPERATIVE REPORT   PREOPERATIVE DIAGNOSIS:  Right medial compartment arthritis.  POSTOPERATIVE DIAGNOSIS:  Right medial compartment arthritis.  PROCEDURE:  Right medial compartmental arthroplasty utilizing Biomet Oxford partial knee system, cemented size C medial tibial tray with keel, medium twin peg cemented femur with 5-mm thick anatomic meniscal bearing.  SURGEON:  Anderson Malta, M.D.  ASSISTANT:  Dyke Brackett, RNFA.  INDICATIONS:  Meiko is a 72 year old patient with right knee pain, who presents for operative management after explanation of risks and benefits.  PROCEDURE IN DETAIL:  The patient was brought to the operating room where spinal anesthetic was induced.  Preoperative antibiotics were administered.  Time-out was called.  Right leg examined under anesthesia.  ACL intact.  The patient had basically full extension and flexion to about 125.  Effusion was present.  Leg was prescrubbed with alcohol and Betadine, allowed to air dry, prepped with DuraPrep solution and draped in a sterile manner.  Charlie Pitter was used to cover the operative field.  Leg was elevated, exsanguinated the Esmarch wrap to a total tourniquet time of 118 minutes.  The median parapatellar approach was made with the leg in a leg holder.  Skin and subcutaneous tissue were sharply divided.  Fat pad was partially excised.  The arthrotomy was extended about 2 cm above the equator with the patella to allow for lateral subluxation but not dislocation of the patella.  The trochlea was intact.  Undersurface of the patella had grade 1 wear.  The lateral compartment had grade 1 wear on the femur only.  The ACL was  intact. Medial compartment was completely worn on both the femoral and tibial sides.  At this time, the sizing spoon was placed and it was sized to medium.  Tibial cutting guide was then attached and the cut was made with the reciprocating saw on the tibia and then it was cut initially with the +2 sim and then it was recut with the 0 sim.  This gave a nice cut confirming anteromedial wear.  Collaterals and posterior neurovascular structures were protected during this.  Next, attention was directed towards the femur.  Intramedullary alignment was utilized and the cutting guide was placed for the posterior condylar cut. Initial 0 cut was also made with the intramedullary alignment utilized. Two holes were utilized through the guide using intramedullary alignment to cut the distal femur in the appropriate angulation in the coronal and sagittal planes.  Once this was established and the posterior cut was made and the initial 0 milling was performed, spacers were placed.  It was estimated that it would be a size 4 or 5 meniscal bearing.  At this time, the milling was performed from a 0 to +2 in extension to match the flexion gap.  Trial components were placed.  With trial components in position, the patient had very  good range of motion and soft tissue tension with a 5 spacer.  Trial components removed.  Final preparations were performed with preparation of the femur and tibia.  After this was performed, irrigation was done.  This was done with 3 liters of irrigating solution.  The components were then cemented into position. Good component position and removal of cement were performed.  Knee was held in about 45 degrees of flexion with compression with a 6-mm spacer in place for maximal compression.  Trial reduction with a 5-mm meniscal bearing demonstrated excellent flexion and extension with appropriate soft tissue laxity medially and no spin-out.  True component placed. Tourniquet  released.  Bleeding points encountered, controlled using bipolar electrocautery.  It should be noted that the posterior meniscus was removed prior to placing the implant.  Following thorough irrigation, the skin edges were anesthetized using Marcaine, morphine, and clonidine.  The capsule was anesthetized using Exparel and Marcaine. Arthrotomy was closed using #1 Vicryl suture followed by interrupted inverted 0 Vicryl suture, 2-0 Vicryl suture, and 3-0 Monocryl.  Aquacel dressing and Ace wrap plus knee immobilizer placed.  The patient tolerated the procedure well without immediate complications. Transferred to the recovery room in stable condition.     Anderson Malta, M.D.     GSD/MEDQ  D:  09/24/2017  T:  09/24/2017  Job:  [id number]

## 2017-09-24 NOTE — H&P (Signed)
TOTAL KNEE ADMISSION H&P  Patient is being admitted for right medial uni-compartment knee arthroplasty.  Subjective:  Chief Complaint:right knee pain.  HPI: Isaac [name], 72 y.o. male, has a history of pain and functional disability in the right knee due to arthritis and has failed non-surgical conservative treatments for greater than 12 weeks to includeNSAID's and/or analgesics, corticosteriod injections, viscosupplementation injections and activity modification.  Onset of symptoms was gradual, starting 8 years ago with gradually worsening course since that time. The patient noted no past surgery on the right knee(s).  Patient currently rates pain in the right knee(s) at 8 out of 10 with activity. Patient has night pain, worsening of pain with activity and weight bearing, pain that interferes with activities of daily living, pain with passive range of motion and joint swelling.  Patient has evidence of subchondral cysts, subchondral sclerosis and joint space narrowing by imaging studies. This patient has had  Stress radiographs which demonstrate adequate opening of the medial joint space. There is no active infection.  No family or personal history of DVT or pulmonary embolism  Patient Active Problem List   Diagnosis Date Noted  . 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 Allergic rhinitis 03/20/2011  . Mild persistent asthma 03/20/2011   Past Medical History:  Diagnosis Date  . Anxiety   . Asthma   . Cancer ([place])    SKIN  . Coronary artery disease   . GERD (gastroesophageal reflux disease)   . Headache   . High triglycerides   . Myocardial infarction ([place])   . Pneumonia    2010    Past Surgical History:  Procedure Laterality Date  . APPENDECTOMY    . CHOLECYSTECTOMY    . COLON SURGERY    . CORONARY ANGIOPLASTY WITH STENT PLACEMENT    . TONSILLECTOMY      Current Facility-Administered Medications  Medication Dose Route Frequency Provider Last Rate Last Dose  .  bupivacaine liposome (EXPAREL) 1.3 % injection 266 mg  20 mL Infiltration To OR Meredith Pel, MD      . chlorhexidine (HIBICLENS) 4 % liquid 4 application  60 mL Topical Once Meredith Pel, MD      . clindamycin (CLEOCIN) IVPB 900 mg  900 mg Intravenous On Call to OR Meredith Pel, MD      . cloNIDine (DURACLON) 100 mcg/mL inj- OR Only  150 mcg Intra-articular Once Meredith Pel, MD      . fentaNYL (SUBLIMAZE) 100 MCG/2ML injection           . lactated ringers infusion   Intravenous Continuous Annye Asa, MD 10 mL/hr at 09/24/17 1044    . midazolam (VERSED) 2 MG/2ML injection            Allergies  Allergen Reactions  . Other     Fruits/pears/green peppers - diarrhea   . Watermelon [Citrullus Vulgaris] Swelling    Throat swelling  . Penicillins Rash    Has patient had a PCN reaction causing immediate rash, facial/tongue/throat swelling, SOB or lightheadedness with hypotension: No Has patient had a PCN reaction causing severe rash involving mucus membranes or skin necrosis: No Has patient had a PCN reaction that required hospitalization: No Has patient had a PCN reaction occurring within the last 10 years: No If all of the above answers are "NO", then may proceed with Cephalosporin use.     Social History   Tobacco Use  . Smoking status: Never Smoker  . Smokeless tobacco:  Never Used  Substance Use Topics  . Alcohol use: Yes    Comment: monthly    History reviewed. No pertinent family history.   Review of Systems  Musculoskeletal: Positive for joint pain.  All other systems reviewed and are negative.   Objective:  Physical Exam  Constitutional: He appears well-developed.  HENT:  Head: Normocephalic.  Eyes: Pupils are equal, round, and reactive to light.  Neck: Normal range of motion.  Cardiovascular: Normal rate.  Respiratory: Effort normal.  Neurological: He is alert.  Skin: Skin is warm.  Psychiatric: He has a normal mood and affect.   Examination of the right knee demonstrates palpable pedal pulses.  Patient has full extension to about 125 of flexion.  There is medial joint line tenderness.  Collaterals are intact.  ACL and PCL are intact.  Mild effusion is present.  Patella tracking is excellent.  Vital signs in last 24 hours: Temp:  [98.2 F (36.8 C)] 98.2 F (36.8 C) (04/03 1038) Pulse Rate:  [65] 65 (04/03 1038) Resp:  [20] 20 (04/03 1038) BP: (148)/(70) 148/70 (04/03 1038) SpO2:  [99 %] 99 % (04/03 1038) Weight:  [193 lb (87.5 kg)] 193 lb (87.5 kg) (04/03 1044)  Labs:   Estimated body mass index is 28.5 kg/m as calculated from the following:   Height as of this encounter: 5\' 9"  (1.753 m).   Weight as of this encounter: 193 lb (87.5 kg).   Imaging Review Plain radiographs demonstrate severe degenerative joint disease of the right knee(s). The overall alignment ismild varus. The bone quality appears to be good for age and reported activity level.   Preoperative templating of the joint replacement has been completed, documented, and submitted to the Operating Room personnel in order to optimize intra-operative equipment management.   Anticipated LOS equal to or greater than 2 midnights due to - Age 71 and older with one or more of the following:  - Obesity  - Expected need for hospital services (PT, OT, Nursing) required for safe  discharge  - Anticipated need for postoperative skilled nursing care or inpatient rehab  - Active co-morbidities: Coronary Artery Disease OR   - Unanticipated findings during/Post Surgery: None  - Patient is a high risk of re-admission due to: none     Assessment/Plan:  End stage medial compartment arthritis, right knee   The patient history, physical examination, clinical judgment of the provider and imaging studies are consistent with end stage degenerative joint disease of the right knee(s) and medial unicompartmental knee arthroplasty is deemed medically necessary.  The treatment options including medical management, injection therapy arthroscopy and arthroplasty were discussed at length. The risks and benefits of medial unicompartmental knee arthroplasty were presented and reviewed. The risks due to aseptic loosening, infection, stiffness, patella tracking problems, thromboembolic complications and other imponderables were discussed. The patient acknowledged the explanation, agreed to proceed with the plan and consent was signed. Patient is being admitted for inpatient treatment for surgery, pain control, PT, OT, prophylactic antibiotics, VTE prophylaxis, progressive ambulation and ADL's and discharge planning. The patient is planning to be discharged home with home health services

## 2017-09-25 ENCOUNTER — Other Ambulatory Visit: Payer: Self-pay

## 2017-09-25 ENCOUNTER — Telehealth (INDEPENDENT_AMBULATORY_CARE_PROVIDER_SITE_OTHER): Payer: Self-pay

## 2017-09-25 ENCOUNTER — Encounter (HOSPITAL_COMMUNITY): Payer: Self-pay | Admitting: Orthopedic Surgery

## 2017-09-25 DIAGNOSIS — M1711 Unilateral primary osteoarthritis, right knee: Secondary | ICD-10-CM | POA: Diagnosis not present

## 2017-09-25 MED ORDER — METHOCARBAMOL 500 MG PO TABS: 500.0000 mg | ORAL_TABLET | Freq: Four times a day (QID) | ORAL | 0 refills | Status: DC | PRN

## 2017-09-25 MED ORDER — DOCUSATE SODIUM 100 MG PO CAPS: 100.0000 mg | ORAL_CAPSULE | Freq: Two times a day (BID) | ORAL | 0 refills | Status: DC

## 2017-09-25 MED ORDER — HYDROCODONE-ACETAMINOPHEN 7.5-325 MG PO TABS: 1.0000 | ORAL_TABLET | Freq: Four times a day (QID) | ORAL | 0 refills | Status: DC | PRN

## 2017-09-25 NOTE — Care Management CC44 (Signed)
Condition Code 44 Documentation Completed  Patient Details  Name: Josel Keo MRN: [id number] Date of Birth: 10-20-1945   Condition Code 44 given:  Yes Patient signature on Condition Code 44 notice:  Yes Documentation of 2 MD's agreement:  Yes Code 44 added to claim:  Yes    Ninfa Meeker, RN 09/25/2017, 1:23 PM

## 2017-09-25 NOTE — Telephone Encounter (Signed)
Allene Dillon, case manager at [facility] would like to know if it would be okay to have patient on observation?  Cb# is [phone].  Thank You.  Pleas advise.

## 2017-09-25 NOTE — Progress Notes (Signed)
Discharge instructions given. Pt verbalized understanding and all questions were answered.  

## 2017-09-25 NOTE — Progress Notes (Signed)
09/25/17 1404  PT Visit Information  Last PT Received On 09/25/17  Assistance Needed +1  History of Present Illness Pt is a 72 y/o male s/p R partial knee replacement. PMH includes skin cancer, asthma, MI and CAD s/p stent placement.   Subjective Data  Patient Stated Goal to go home   Precautions  Precautions Knee  Precaution Booklet Issued Yes (comment)  Precaution Comments Verbally reviewed sitting HEP as pt with increased pain.   Required Braces or Orthoses Knee Immobilizer - Right  Knee Immobilizer - Right Other (comment) (until discontinued )  Restrictions  Weight Bearing Restrictions Yes  RLE Weight Bearing WBAT  Pain Assessment  Pain Assessment Faces  Faces Pain Scale 6  Pain Location R knee   Pain Descriptors / Indicators Aching;Operative site guarding  Pain Intervention(s) Limited activity within patient's tolerance;Monitored during session;Repositioned  Cognition  Arousal/Alertness Awake/alert  Behavior During Therapy WFL for tasks assessed/performed  Overall Cognitive Status Within Functional Limits for tasks assessed  Bed Mobility  Overal bed mobility Needs Assistance  Bed Mobility Supine to Sit  Supine to sit Supervision  General bed mobility comments Supervision for safety.   Transfers  Overall transfer level Needs assistance  Equipment used Rolling walker (2 wheeled)  Transfers Sit to/from Stand  Sit to Stand Min guard  General transfer comment Min guard for safety. No physical assist required. Verbal cues for safe hand placement.   Ambulation/Gait  Ambulation/Gait assistance Min guard;Supervision  Ambulation Distance (Feet) 100 Feet  Assistive device Rolling walker (2 wheeled)  Gait Pattern/deviations Step-through pattern;Decreased step length - right;Decreased step length - left;Decreased weight shift to right;Antalgic  General Gait Details Slow, antalgic gait. Improved steadiness noted with gait this session and required min guard to supervision for  safety.   Gait velocity Decreased   Gait velocity interpretation Below normal speed for age/gender  Stairs Yes  Stairs assistance Min guard  Stair Management One rail Right;With cane;Step to pattern;Forwards  Number of Stairs 4  General stair comments Improved steadiness noted with stair navigation and required min guard for safety with use of rail and cane. Pt reports he feels comfortable with performing steps at home.   Balance  Overall balance assessment Needs assistance  Sitting-balance support No upper extremity supported;Feet supported  Sitting balance-Leahy Scale Good  Sitting balance - Comments Educated about dressing while sitting and pt able to assist without LOB.   Standing balance support Bilateral upper extremity supported;No upper extremity supported;During functional activity  Standing balance-Leahy Scale Fair  Standing balance comment Able to maintain static standing to pull up shorts to dress.   General Comments  General comments (skin integrity, edema, etc.) Pt's wife present during session.   PT - End of Session  Equipment Utilized During Treatment Gait belt;Right knee immobilizer  Activity Tolerance Patient tolerated treatment well  Patient left in bed;with call bell/phone within reach;with family/visitor present  Nurse Communication Mobility status;Patient requests pain meds  CPM Right Knee  CPM Right Knee Off   PT - Assessment/Plan  PT Plan Current plan remains appropriate  PT Visit Diagnosis Other abnormalities of gait and mobility (R26.89);Unsteadiness on feet (R26.81);Pain  Pain - Right/Left Right  Pain - part of body Knee  PT Frequency (ACUTE ONLY) 7X/week  Follow Up Recommendations Follow surgeon's recommendation for DC plan and follow-up therapies;Supervision for mobility/OOB  PT equipment None recommended by PT  AM-PAC PT "6 Clicks" Daily Activity Outcome Measure  Difficulty turning over in bed (including adjusting bedclothes, sheets and blankets)?  4   Difficulty moving from lying on back to sitting on the side of the bed?  3  Difficulty sitting down on and standing up from a chair with arms (e.g., wheelchair, bedside commode, etc,.)? 1  Help needed moving to and from a bed to chair (including a wheelchair)? 3  Help needed walking in hospital room? 3  Help needed climbing 3-5 steps with a railing?  3  6 Click Score 17  Mobility G Code  CK  PT Goal Progression  Progress towards PT goals Progressing toward goals  Acute Rehab PT Goals  PT Goal Formulation With patient  Time For Goal Achievement 10/09/17  Potential to Achieve Goals Good  PT Time Calculation  PT Start Time (ACUTE ONLY) 1346  PT Stop Time (ACUTE ONLY) 1405  PT Time Calculation (min) (ACUTE ONLY) 19 min  PT General Charges  $$ ACUTE PT VISIT 1 Visit  PT Treatments  $Gait Training 8-22 mins   Pt with improved steadiness noted with gait and stair navigation and required min guard to supervision for safety. Pt reports improved comfort with mobility and reports he feels like he is safe to d/c home. Reviewed sitting HEP and gave handout for stair navigation with RW as precaution. Will continue to follow acutely to maximize functional mobility independence and safety.   Leighton Ruff, PT, DPT  Acute Rehabilitation Services  Pager: [phone]

## 2017-09-25 NOTE — Progress Notes (Signed)
Subjective: Pt stable - minimal pain    Objective: Vital signs in last 24 hours: Temp:  [97.5 F (36.4 C)-98.2 F (36.8 C)] 97.9 F (36.6 C) (04/04 0400) Pulse Rate:  [63-72] 71 (04/04 0400) Resp:  [9-20] 16 (04/04 0400) BP: (103-148)/(58-71) 134/66 (04/04 0400) SpO2:  [92 %-100 %] 98 % (04/04 0400) Weight:  [193 lb (87.5 kg)] 193 lb (87.5 kg) (04/03 1044)  Intake/Output from previous day: 04/03 0701 - 04/04 0700 In: 1513.3 [I.V.:1513.3] Out: 1450 [Urine:1300; Blood:150] Intake/Output this shift: No intake/output data recorded.  Exam:  Compartment soft  Labs: No results for input(s): HGB in the last 72 hours. No results for input(s): WBC, RBC, HCT, PLT in the last 72 hours. No results for input(s): NA, K, CL, CO2, BUN, CREATININE, GLUCOSE, CALCIUM in the last 72 hours. No results for input(s): LABPT, INR in the last 72 hours.  Assessment/Plan: Plan dc today after PT - some time this afternoon if safe with PT   G Alphonzo Severance 09/25/2017, 8:22 AM

## 2017-09-25 NOTE — Discharge Summary (Signed)
Physician Discharge Summary  Patient ID: Isaac [name] MRN: [id number] DOB/AGE: Sep 05, 1945 72 y.o.  Admit date: 09/24/2017 Discharge date: 09/25/2017  Admission Diagnoses:  Active Problems:   Arthritis of knee   Discharge Diagnoses:  Same  Surgeries: Procedure(s): RIGHT UNICOMPARTMENTAL KNEE on 09/24/2017   Consultants:   Discharged Condition: Stable  Hospital Course: Marcelis Wissner is an 72 y.o. male who was admitted 09/24/2017 with a chief complaint of right knee pain, and found to have a diagnosis of right knee medial compartment arthritis.  They were brought to the operating room on 09/24/2017 and underwent the above named procedures.  Patient tolerated the procedure well.  He was seen by physical therapy on postop day #1 and was noted to be mobilizing well.  He is discharged to home in good condition on aspirin for DVT prophylaxis along with home health physical therapy and CPM machine to facilitate range of motion.  I will see him back in 2 weeks for clinical recheck.  Antibiotics given:  Anti-infectives (From admission, onward)   Start     Dose/Rate Route Frequency Ordered Stop   09/24/17 2000  vancomycin (VANCOCIN) IVPB 1000 mg/200 mL premix     1,000 mg 200 mL/hr over 60 Minutes Intravenous Every 12 hours 09/24/17 1724 09/24/17 2151   09/24/17 0959  clindamycin (CLEOCIN) IVPB 900 mg     900 mg 100 mL/hr over 30 Minutes Intravenous On call to O.R. 09/24/17 2376 09/24/17 1255    .  Recent vital signs:  Vitals:   09/25/17 0400 09/25/17 1300  BP: 134/66 133/68  Pulse: 71 68  Resp: 16 18  Temp: 97.9 F (36.6 C) (!) 97 F (36.1 C)  SpO2: 98% 99%    Recent laboratory studies:  Results for orders placed or performed during the hospital encounter of 09/17/17  Surgical pcr screen  Result Value Ref Range   MRSA, PCR NEGATIVE NEGATIVE   Staphylococcus aureus NEGATIVE NEGATIVE  CBC  Result Value Ref Range   WBC 7.0 4.0 - 10.5 K/uL   RBC 3.97 (L) 4.22 - 5.81 MIL/uL   Hemoglobin  13.0 13.0 - 17.0 g/dL   HCT 39.3 39.0 - 52.0 %   MCV 99.0 78.0 - 100.0 fL   MCH 32.7 26.0 - 34.0 pg   MCHC 33.1 30.0 - 36.0 g/dL   RDW 13.6 11.5 - 15.5 %   Platelets 227 150 - 400 K/uL  Basic metabolic panel  Result Value Ref Range   Sodium 138 135 - 145 mmol/L   Potassium 4.6 3.5 - 5.1 mmol/L   Chloride 104 101 - 111 mmol/L   CO2 20 (L) 22 - 32 mmol/L   Glucose, Bld 107 (H) 65 - 99 mg/dL   BUN 34 (H) 6 - 20 mg/dL   Creatinine, Ser 1.23 0.61 - 1.24 mg/dL   Calcium 9.1 8.9 - 10.3 mg/dL   GFR calc non Af Amer 57 (L) >60 mL/min   GFR calc Af Amer >60 >60 mL/min   Anion gap 14 5 - 15    Discharge Medications:   Allergies as of 09/25/2017      Reactions   Other    Fruits/pears/green peppers - diarrhea   Watermelon [citrullus Vulgaris] Swelling   Throat swelling   Penicillins Rash   Has patient had a PCN reaction causing immediate rash, facial/tongue/throat swelling, SOB or lightheadedness with hypotension: No Has patient had a PCN reaction causing severe rash involving mucus membranes or skin necrosis: No Has patient had a PCN  reaction that required hospitalization: No Has patient had a PCN reaction occurring within the last 10 years: No If all of the above answers are "NO", then may proceed with Cephalosporin use.      Medication List    TAKE these medications   acetaminophen 500 MG tablet Commonly known as:  TYLENOL Take 500-1,000 mg by mouth every 8 (eight) hours as needed (for pain.).   albuterol 108 (90 Base) MCG/ACT inhaler Commonly known as:  PROAIR HFA Inhale 2 puffs into the lungs every 4 (four) hours as needed for wheezing or shortness of breath. What changed:  reasons to take this   ALIGN 4 MG Caps Take 4 mg by mouth at bedtime.   ALPRAZolam 0.25 MG tablet Commonly known as:  XANAX Take 0.25 mg by mouth daily as needed (for anxiety attacks).   aspirin EC 81 MG tablet Take 81 mg by mouth at bedtime.   atorvastatin 40 MG tablet Commonly known as:   LIPITOR Take 20 mg by mouth every evening.   CoQ10 200 MG Caps Take 200 mg by mouth at bedtime.   docusate sodium 100 MG capsule Commonly known as:  COLACE Take 1 capsule (100 mg total) by mouth 2 (two) times daily.   HYDROcodone-acetaminophen 7.5-325 MG tablet Commonly known as:  NORCO Take 1 tablet by mouth every 6 (six) hours as needed for severe pain (pain score 7-10).   ipratropium 0.06 % nasal spray Commonly known as:  ATROVENT Place 2 sprays into both nostrils 3 (three) times daily. What changed:    when to take this  reasons to take this   loratadine 10 MG tablet Commonly known as:  CLARITIN Take 10 mg by mouth daily.   methocarbamol 500 MG tablet Commonly known as:  ROBAXIN Take 1 tablet (500 mg total) by mouth every 6 (six) hours as needed for muscle spasms.   metoprolol succinate 50 MG 24 hr tablet Commonly known as:  TOPROL-XL Take 25-50 mg by mouth 2 (two) times daily. Take 1 tablet (50 mg) in the moring & 0.5 tablet (25 mg) at night. Take with or immediately following a meal.   multivitamin with minerals Tabs tablet Take 1 tablet by mouth daily.   PARoxetine 20 MG tablet Commonly known as:  PAXIL Take 20 mg by mouth daily.   TRILIPIX 135 MG capsule Generic drug:  Choline Fenofibrate Take 135 mg by mouth every evening.   vitamin C 1000 MG tablet Take 1,000 mg by mouth daily.       Diagnostic Studies: Xr Knee 3 View Right  Result Date: 09/04/2017 AP valgus stress view of the right knee and 15 degrees of flexion demonstrates correction of varus deformity and opening of the medial joint space to normal.   Disposition:   Discharge Instructions    Call MD / Call 911   Complete by:  As directed    If you experience chest pain or shortness of breath, CALL 911 and be transported to the hospital emergency room.  If you develope a fever above 101 F, pus (white drainage) or increased drainage or redness at the wound, or calf pain, call your surgeon's  office.   Constipation Prevention   Complete by:  As directed    Drink plenty of fluids.  Prune juice may be helpful.  You may use a stool softener, such as Colace (over the counter) 100 mg twice a day.  Use MiraLax (over the counter) for constipation as needed.   Diet -  low sodium heart healthy   Complete by:  As directed    Discharge instructions   Complete by:  As directed    Weight bearing as tolerated CPM 1 hour 3 times a day Ok to shower - dressing waterproof   Increase activity slowly as tolerated   Complete by:  As directed       Follow-up Information    [facility].   Specialty:  [place] Why:  A representative from [facility] will contact you to arrange start date and time for your therapy. Contact information: [address] [place] [address] [phone]            Signed: Anderson Malta 09/25/2017, 6:51 PM

## 2017-09-25 NOTE — Care Management Obs Status (Signed)
[place] NOTIFICATION   Patient Details  Name: Elimelech Houseman MRN: [id number] Date of Birth: 1945/07/21   Medicare Observation Status Notification Given:  Yes    Ninfa Meeker, RN 09/25/2017, 1:23 PM

## 2017-09-25 NOTE — Care Management Note (Signed)
Case Management Note  Patient Details  Name: Isaac [name] MRN: [id number] Date of Birth: 06-29-1945  Subjective/Objective:   72 yr old gentleman s/p right unicompartmental knee arthroplasty.                  Action/Plan: Case manager spoke with patient and his wife concerning discharge plan. Patient was preoperatively setup with [facility], no changes.  He will have family support.    Expected Discharge Date:  09/25/17               Expected Discharge Plan:  [place]  In-House Referral:  NA  Discharge planning Services  CM Consult  Post Acute Care Choice:  Durable Medical Equipment, Home Health Choice offered to:  Patient, Spouse  DME Arranged:  3-N-1, Walker rolling, CPM DME Agency:  [employer]  HH Arranged:  PT [place]:  [facility] (formerly [employer])  Status of Service:  Completed, signed off  If discussed at [employer], dates discussed:    Additional Comments:  Ninfa Meeker, RN 09/25/2017, 1:25 PM

## 2017-09-25 NOTE — Evaluation (Signed)
Physical Therapy Evaluation Patient Details Name: Isaac [name] MRN: [id number] DOB: Nov 09, 1945 Today's Date: 09/25/2017   History of Present Illness  Pt is a 72 y/o male s/p R partial knee replacement. PMH includes skin cancer, asthma, MI and CAD s/p stent placement.   Clinical Impression  Pt is s/p surgery above with deficits below. Pt tolerated gait and stair training well, however, did exhibit some unsteadiness requiring min to min guard A with RW. Reviewed supine HEP and knee precautions. Feel pt will need afternoon session to practice stair navigation once more to ensure safety prior to d/c home. Will continue to follow acutely to maximize functional mobility independence and safety.     Follow Up Recommendations Follow surgeon's recommendation for DC plan and follow-up therapies;Supervision for mobility/OOB    Equipment Recommendations  None recommended by PT    Recommendations for Other Services       Precautions / Restrictions Precautions Precautions: Knee Precaution Booklet Issued: Yes (comment) Precaution Comments: Reviewed supine ther ex with pt.  Required Braces or Orthoses: Knee Immobilizer - Right Knee Immobilizer - Right: Other (comment)(until discontinued ) Restrictions Weight Bearing Restrictions: Yes RLE Weight Bearing: Weight bearing as tolerated      Mobility  Bed Mobility Overal bed mobility: Needs Assistance Bed Mobility: Supine to Sit     Supine to sit: Supervision     General bed mobility comments: Supervision for safety.   Transfers Overall transfer level: Needs assistance Equipment used: Rolling walker (2 wheeled) Transfers: Sit to/from Stand Sit to Stand: Min guard;Min assist         General transfer comment: Min to min guard for lift assist and steadying assist. Verbal cues for safe hand placement.   Ambulation/Gait Ambulation/Gait assistance: Min assist;Min guard Ambulation Distance (Feet): 150 Feet Assistive device: Rolling walker (2  wheeled) Gait Pattern/deviations: Step-to pattern;Step-through pattern;Decreased step length - right;Decreased step length - left;Decreased weight shift to right;Antalgic Gait velocity: Decreased  Gait velocity interpretation: Below normal speed for age/gender General Gait Details: Slow, antalgic gait. Initially unsteady requiring min to min guard for steadying. Verbal cues to increase weightshift to RLE and increase LLE step length. Pt with improved stability by end of gait training. Verbal cues for sequencing using RW.   Stairs Stairs: Yes Stairs assistance: Min guard;Min assist Stair Management: One rail Right;With cane;Step to pattern;Forwards Number of Stairs: 2 General stair comments: SLightly unsteady stair navigation requiring min to min guard A. Verbal cues for sequencing using cane and rail on the R.   Wheelchair Mobility    Modified Rankin (Stroke Patients Only)       Balance Overall balance assessment: Needs assistance Sitting-balance support: No upper extremity supported;Feet supported Sitting balance-Leahy Scale: Good     Standing balance support: Bilateral upper extremity supported;During functional activity Standing balance-Leahy Scale: Poor Standing balance comment: Reliant on BUE support                              Pertinent Vitals/Pain Pain Assessment: 0-10 Pain Score: 1  Pain Location: R knee  Pain Descriptors / Indicators: Aching;Operative site guarding Pain Intervention(s): Limited activity within patient's tolerance;Monitored during session;Repositioned    Home Living Family/patient expects to be discharged to:: Private residence Living Arrangements: Spouse/significant other Available Help at Discharge: Family;Available 24 hours/day Type of Home: House Home Access: Stairs to enter Entrance Stairs-Rails: Right Entrance Stairs-Number of Steps: 3 Home Layout: Two level Home Equipment: Walker - 2 wheels;Bedside commode;Other  (  comment);Crutches(cpm; WALKING POLE )      Prior Function Level of Independence: Independent with assistive device(s)         Comments: Used walking pole occasionally      Hand Dominance   Dominant Hand: Right    Extremity/Trunk Assessment        Lower Extremity Assessment Lower Extremity Assessment: RLE deficits/detail RLE Deficits / Details: Sensory in tact. Deficits consistent with post op pain and weakness. Able to perform ther ex below.     Cervical / Trunk Assessment Cervical / Trunk Assessment: Normal  Communication   Communication: No difficulties  Cognition Arousal/Alertness: Awake/alert Behavior During Therapy: WFL for tasks assessed/performed Overall Cognitive Status: Within Functional Limits for tasks assessed                                        General Comments General comments (skin integrity, edema, etc.): Pt's wife present during session     Exercises Total Joint Exercises Ankle Circles/Pumps: AROM;Both;20 reps Quad Sets: AROM;Right;10 reps Towel Squeeze: AROM;Both;10 reps Short Arc Quad: AROM;Right;10 reps Heel Slides: AROM;Right;10 reps Hip ABduction/ADduction: AROM;Right;10 reps Straight Leg Raises: AROM;Right;10 reps Goniometric ROM: 6-90   Assessment/Plan    PT Assessment Patient needs continued PT services  PT Problem List Decreased strength;Decreased mobility;Decreased balance;Decreased knowledge of use of DME;Decreased knowledge of precautions;Pain;Decreased range of motion       PT Treatment Interventions DME instruction;Gait training;Stair training;Functional mobility training;Therapeutic activities;Therapeutic exercise;Balance training;Neuromuscular re-education;Patient/family education    PT Goals (Current goals can be found in the Care Plan section)  Acute Rehab PT Goals Patient Stated Goal: to go home  PT Goal Formulation: With patient Time For Goal Achievement: 10/09/17 Potential to Achieve Goals:  Good    Frequency 7X/week   Barriers to discharge        Co-evaluation               AM-PAC PT "6 Clicks" Daily Activity  Outcome Measure Difficulty turning over in bed (including adjusting bedclothes, sheets and blankets)?: None Difficulty moving from lying on back to sitting on the side of the bed? : A Little Difficulty sitting down on and standing up from a chair with arms (e.g., wheelchair, bedside commode, etc,.)?: Unable Help needed moving to and from a bed to chair (including a wheelchair)?: A Little Help needed walking in hospital room?: A Little Help needed climbing 3-5 steps with a railing? : A Little 6 Click Score: 17    End of Session Equipment Utilized During Treatment: Gait belt;Right knee immobilizer Activity Tolerance: Patient tolerated treatment well Patient left: in chair;with call bell/phone within reach;with family/visitor present Nurse Communication: Mobility status PT Visit Diagnosis: Other abnormalities of gait and mobility (R26.89);Unsteadiness on feet (R26.81);Pain Pain - Right/Left: Right Pain - part of body: Knee    Time: 1030-1105 PT Time Calculation (min) (ACUTE ONLY): 35 min   Charges:   PT Evaluation $PT Eval Low Complexity: 1 Low PT Treatments $Gait Training: 8-22 mins   PT G Codes:        Leighton Ruff, PT, DPT  Acute Rehabilitation Services  Pager: [phone]   Rudean Hitt 09/25/2017, 11:18 AM

## 2017-09-25 NOTE — Telephone Encounter (Signed)
IC advised per Dr Marlou Sa ok.

## 2017-09-26 ENCOUNTER — Telehealth (INDEPENDENT_AMBULATORY_CARE_PROVIDER_SITE_OTHER): Payer: Self-pay | Admitting: Orthopedic Surgery

## 2017-09-26 NOTE — Telephone Encounter (Signed)
Lattie Haw from [facility] [place] called wanting to requesting verbal approval on PT 3 week 2. CB # [phone]

## 2017-09-26 NOTE — Telephone Encounter (Signed)
IC verbal given.  

## 2017-10-09 ENCOUNTER — Encounter (INDEPENDENT_AMBULATORY_CARE_PROVIDER_SITE_OTHER): Payer: Self-pay | Admitting: Orthopedic Surgery

## 2017-10-09 ENCOUNTER — Ambulatory Visit (INDEPENDENT_AMBULATORY_CARE_PROVIDER_SITE_OTHER): Payer: Medicare Other

## 2017-10-09 ENCOUNTER — Ambulatory Visit (INDEPENDENT_AMBULATORY_CARE_PROVIDER_SITE_OTHER): Payer: Medicare Other | Admitting: Orthopedic Surgery

## 2017-10-09 DIAGNOSIS — Z96651 Presence of right artificial knee joint: Secondary | ICD-10-CM | POA: Diagnosis not present

## 2017-10-11 ENCOUNTER — Encounter (INDEPENDENT_AMBULATORY_CARE_PROVIDER_SITE_OTHER): Payer: Self-pay | Admitting: Orthopedic Surgery

## 2017-10-11 NOTE — Progress Notes (Signed)
   Post-Op Visit Note   Patient: Isaac [name]           Date of Birth: 1945-08-11           MRN: [id number] Visit Date: 10/09/2017 PCP: Nicola Girt, DO   Assessment & Plan:  Chief Complaint:  Chief Complaint  Patient presents with  . Right Knee - Routine Post Op   Visit Diagnoses:  1. Status post unicompartmental knee replacement, right     Plan: [name] is a patient who is now 2 weeks out right knee unicompartmental knee replacement.  He is doing well.  Is walking without assistive devices.  On examination the incision is intact.  He is bending easily to about 110.  Tylenol for pain.  No calf tenderness.  I do not think he needs any home health PT.  I will see him back in 4 weeks for clinical recheck and release  Follow-Up Instructions: Return in about 1 month (around 11/06/2017).   Orders:  Orders Placed This Encounter  Procedures  . XR KNEE 3 VIEW RIGHT   No orders of the defined types were placed in this encounter.   Imaging: No results found.  PMFS History: Patient Active Problem List   Diagnosis Date Noted  . Arthritis of knee 09/24/2017  . 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 Allergic rhinitis 03/20/2011  . Mild persistent asthma 03/20/2011   Past Medical History:  Diagnosis Date  . Anxiety   . Asthma   . Cancer ([place])    SKIN  . Coronary artery disease   . GERD (gastroesophageal reflux disease)   . Headache   . High triglycerides   . Myocardial infarction ([place])   . Pneumonia    2010    History reviewed. No pertinent family history.  Past Surgical History:  Procedure Laterality Date  . APPENDECTOMY    . CHOLECYSTECTOMY    . COLON SURGERY    . CORONARY ANGIOPLASTY WITH STENT PLACEMENT    . PARTIAL KNEE ARTHROPLASTY Right 09/24/2017   Procedure: RIGHT UNICOMPARTMENTAL KNEE;  Surgeon: Meredith Pel, MD;  Location: [facility];  Service: Orthopedics;  Laterality: Right;  . TONSILLECTOMY     Social History   Occupational History  .  Not on file  Tobacco Use  . Smoking status: Never Smoker  . Smokeless tobacco: Never Used  Substance and Sexual Activity  . Alcohol use: Yes    Comment: monthly  . Drug use: No  . Sexual activity: Not on file

## 2017-10-13 ENCOUNTER — Telehealth (INDEPENDENT_AMBULATORY_CARE_PROVIDER_SITE_OTHER): Payer: Self-pay | Admitting: Orthopedic Surgery

## 2017-10-13 NOTE — Telephone Encounter (Signed)
[place] for therapy there?

## 2017-10-13 NOTE — Telephone Encounter (Signed)
y

## 2017-10-13 NOTE — Telephone Encounter (Signed)
Patient called asking if he could go to therapy at the [facility] in [place] since it would be closer to his house. CB # [phone]

## 2017-10-13 NOTE — Telephone Encounter (Signed)
Can you please advise if there are any restrictions? Just ROM and strengthening?

## 2017-10-14 ENCOUNTER — Telehealth (INDEPENDENT_AMBULATORY_CARE_PROVIDER_SITE_OTHER): Payer: Self-pay | Admitting: Orthopedic Surgery

## 2017-10-14 NOTE — Telephone Encounter (Signed)
Order for P.T. Faxed to [facility] outpt rehab [phone]

## 2017-10-14 NOTE — Telephone Encounter (Signed)
No restrictions.  Just range of motion and strengthening.  Thank you

## 2017-10-14 NOTE — Telephone Encounter (Signed)
faxed

## 2017-10-15 ENCOUNTER — Other Ambulatory Visit: Payer: Self-pay

## 2017-10-15 ENCOUNTER — Ambulatory Visit: Payer: Medicare Other | Attending: Orthopedic Surgery | Admitting: Physical Therapy

## 2017-10-15 ENCOUNTER — Encounter: Payer: Self-pay | Admitting: Physical Therapy

## 2017-10-15 DIAGNOSIS — R29898 Other symptoms and signs involving the musculoskeletal system: Secondary | ICD-10-CM | POA: Insufficient documentation

## 2017-10-15 DIAGNOSIS — M25561 Pain in right knee: Secondary | ICD-10-CM | POA: Insufficient documentation

## 2017-10-15 DIAGNOSIS — M25661 Stiffness of right knee, not elsewhere classified: Secondary | ICD-10-CM | POA: Insufficient documentation

## 2017-10-15 NOTE — Therapy (Signed)
[facility] [address]  [place], [place], [address] Phone: [phone]   Fax:  [phone]  Physical Therapy Evaluation  Patient Details  Name: Isaac [name] MRN: [id number] Date of Birth: Oct 31, 1945 Referring Provider: Dr. Marlou [name]   Encounter Date: 10/15/2017  PT End of Session - 10/15/17 1433    Visit Number  1    Number of Visits  8    Date for PT Re-Evaluation  11/12/17    PT Start Time  9371    PT Stop Time  1425    PT Time Calculation (min)  31 min    Activity Tolerance  Patient tolerated treatment well    Behavior During Therapy  [facility] for tasks assessed/performed       Past Medical History:  Diagnosis Date  . Anxiety   . Asthma   . Cancer ([place])    SKIN  . Coronary artery disease   . GERD (gastroesophageal reflux disease)   . Headache   . High triglycerides   . Myocardial infarction ([place])   . Pneumonia    2010    Past Surgical History:  Procedure Laterality Date  . APPENDECTOMY    . CHOLECYSTECTOMY    . COLON SURGERY    . CORONARY ANGIOPLASTY WITH STENT PLACEMENT    . PARTIAL KNEE ARTHROPLASTY Right 09/24/2017   Procedure: RIGHT UNICOMPARTMENTAL KNEE;  Surgeon: Isaac Pel, MD;  Location: [place];  Service: Orthopedics;  Laterality: Right;  . TONSILLECTOMY      There were no vitals filed for this visit.   Subjective Assessment - 10/15/17 1356    Subjective  Patient s/p UKA of R knee on 09/24/17 - had been doing injections for some time before surgery. Reports no pain at knee - only some numbness and sensitivity with cloth over incision. Some swelling - does ice daily (1x).     Patient Stated Goals  get back to biking and kayaking    Currently in Pain?  No/denies    Pain Score  0-No pain         [facility] PT Assessment - 10/15/17 1401      Assessment   Medical Diagnosis  s/p R UKA    Referring Provider  Dr. Marlou [name]    Onset Date/Surgical Date  09/24/17    Next MD Visit  11/10/17    Prior  Therapy  not for this issue      Precautions   Precautions  None      Restrictions   Weight Bearing Restrictions  No      Balance Screen   Has the patient fallen in the past 6 months  No    Has the patient had a decrease in activity level because of a fear of falling?   No    Is the patient reluctant to leave their home because of a fear of falling?   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    Physiological scientist at [employer], Optician, dispensing   Overall Cognitive Status  Within Functional Limits for tasks assessed      Observation/Other Assessments   Focus on Therapeutic Outcomes (FOTO)   Knee:57 (43% limited, predicted 28% limited)      Sensation   Light Touch  Appears Intact      Coordination   Gross Motor Movements are Fluid and Coordinated  Yes      Posture/Postural Control   Posture/Postural Control  No significant limitations      ROM / Strength   AROM / PROM / Strength  AROM;PROM;Strength      AROM   AROM Assessment Site  Knee    Right/Left Knee  Right    Right Knee Extension  1    Right Knee Flexion  120      PROM   PROM Assessment Site  Knee    Right/Left Knee  Right    Right Knee Extension  0      Strength   Strength Assessment Site  Hip;Knee    Right/Left Hip  Right;Left    Right Hip Flexion  4/5    Left Hip Flexion  4+/5    Right/Left Knee  Right;Left    Right Knee Flexion  4-/5    Right Knee Extension  4/5    Left Knee Flexion  4+/5    Left Knee Extension  4+/5      Palpation    Patella mobility  slight hypomobility sup/inf    Palpation comment  diffusely non-tender; noted swelling                Objective measurements completed on examination: See above findings.      [facility] [place] Adult PT Treatment/Exercise - 10/15/17 1401      Exercises   Exercises  Knee/Hip      Knee/Hip Exercises: Standing   Functional Squat  15 reps B UE support      Knee/Hip Exercises: Seated   Long Arc Quad  Strengthening;Right;15 reps + ball squeeze      Knee/Hip Exercises: Supine   Bridges  Strengthening;Both;15 reps    Straight Leg Raises  Strengthening;Right;15 reps    Straight Leg Raise with External Rotation  Strengthening;Right;15 reps             PT Education - 10/15/17 1433    Education provided  Yes    Education Details  exam findings, POC, HEP    Person(s) Educated  Patient    Methods  Explanation;Demonstration;Handout    Comprehension  Verbalized understanding;Returned demonstration          PT Long Term Goals - 10/15/17 1434      PT LONG TERM GOAL #1   Title  patient to be independent with advanced HEP    Status  New    [place] Date  11/12/17      PT LONG TERM GOAL #2   Title  patient to demonstrate R knee AROM 0-125    Status  New    Target Date  11/12/17      PT LONG TERM GOAL #3   Title  patient to demsonstrate good quad control with no instability with eccentric step down    Status  New    Target Date  11/12/17      PT LONG TERM GOAL #4  Title  patient to report ability to return to normal biking and kayaking    Status  New    Target Date  11/12/17      PT LONG TERM GOAL #5   Title  patient to improve R knee strength to >/= 4+/5    Status  New    Target Date  11/12/17             Plan - 10/15/17 1436    Clinical Impression Statement  Isaac [name] is a pleasant 72 y/o male s/p R UKA on 09/24/17. Patient today wtih good gait mechanics with no use of AD, lacking slight heel strike and knee extension throughout stance  phase likely related to swelling. Good AROM and strength being only 3 weeks post-op. Patient today given initial HEP for gentle stretching and strengthening with good toelrance and carryover. patient to benefit form skilled PT intervention to address functional mobility limitations.     Clinical Presentation  Stable    Clinical Decision Making  Low    Rehab Potential  Excellent    PT Frequency  2x / week    PT Duration  4 weeks    PT Treatment/Interventions  ADLs/Self Care Home Management;Cryotherapy;Electrical Stimulation;Therapeutic exercise;Therapeutic activities;Functional mobility training;Stair training;Gait training;Balance training;Neuromuscular re-education;Manual techniques;Vasopneumatic Device;Taping;Dry needling;Passive range of motion;Patient/family education    Consulted and Agree with Plan of Care  Patient       Patient will benefit from skilled therapeutic intervention in order to improve the following deficits and impairments:  Pain, Decreased strength, Decreased mobility, Decreased activity tolerance, Decreased range of motion  Visit Diagnosis: Acute pain of right knee  Stiffness of right knee, not elsewhere classified  Other symptoms and signs involving the musculoskeletal system     Problem List Patient Active Problem List   Diagnosis Date Noted  . Arthritis of knee 09/24/2017  . 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 Allergic rhinitis 03/20/2011  . Mild persistent asthma 03/20/2011    Lanney Gins, PT, DPT 10/15/17 3:32 PM    [facility] [address]  [place], [place], [address] Phone: [phone]   Fax:  [phone]  Name: Isaac [name] MRN: [id number] Date of Birth: 1946-06-17

## 2017-10-15 NOTE — Patient Instructions (Signed)
Strengthening: Straight Leg Raise (Phase 1)   Tighten muscles on front of right thigh, then lift leg __~8__ inches from surface, keeping knee locked.  Repeat _15___ times per set. Do _2___ sets per session.   Straight Leg Raise: With External Leg Rotation   Lie on back with right leg straight, opposite leg bent. Rotate straight leg out and lift __~8__ inches. Repeat __15__ times per set. Do __2__ sets per session.   Bridging   Slowly raise buttocks from floor, keeping stomach tight. Repeat __15__ times per set. Do __2__ sets per session.   Long [employer]   Straighten operated leg and try to hold it __5__ seconds. Use ____ lbs on ankle. Repeat __15__ times. Do __2__ sessions a day. **squeeze pillow/ball/towel between knees**  Squat: Double Leg (Supported)   With feet shoulder width apart, hold support. Squat, keeping lower leg vertical, knee in line with second toe. Use legs, do not pull up and down with arms. Repeat _15__ times per set.

## 2017-10-17 ENCOUNTER — Ambulatory Visit: Payer: Medicare Other | Admitting: Allergy & Immunology

## 2017-10-21 ENCOUNTER — Ambulatory Visit: Payer: Medicare Other

## 2017-10-21 DIAGNOSIS — M25561 Pain in right knee: Secondary | ICD-10-CM

## 2017-10-21 DIAGNOSIS — R29898 Other symptoms and signs involving the musculoskeletal system: Secondary | ICD-10-CM

## 2017-10-21 DIAGNOSIS — M25661 Stiffness of right knee, not elsewhere classified: Secondary | ICD-10-CM

## 2017-10-21 NOTE — Therapy (Signed)
[facility] [address]  [place], [place], [address] Phone: [phone]   Fax:  [phone]  Physical Therapy Treatment  Patient Details  Name: Isaac [name] MRN: [id number] Date of Birth: Sep 11, 1945 Referring Provider: Dr. Marlou Sa   Encounter Date: 10/21/2017  PT End of Session - 10/21/17 0935    Visit Number  2    Number of Visits  8    Date for PT Re-Evaluation  11/12/17    PT Start Time  0928    PT Stop Time  1023    PT Time Calculation (min)  55 min    Activity Tolerance  Patient tolerated treatment well    Behavior During Therapy  [facility] for tasks assessed/performed       Past Medical History:  Diagnosis Date  . Anxiety   . Asthma   . Cancer ([place])    SKIN  . Coronary artery disease   . GERD (gastroesophageal reflux disease)   . Headache   . High triglycerides   . Myocardial infarction ([place])   . Pneumonia    2010    Past Surgical History:  Procedure Laterality Date  . APPENDECTOMY    . CHOLECYSTECTOMY    . COLON SURGERY    . CORONARY ANGIOPLASTY WITH STENT PLACEMENT    . PARTIAL KNEE ARTHROPLASTY Right 09/24/2017   Procedure: RIGHT UNICOMPARTMENTAL KNEE;  Surgeon: Meredith Pel, MD;  Location: [facility];  Service: Orthopedics;  Laterality: Right;  . TONSILLECTOMY      There were no vitals filed for this visit.  Subjective Assessment - 10/21/17 0931    Subjective  Pt. reporting pain free at R knee only stiffness in mornings.      Patient Stated Goals  get back to biking and kayaking    Currently in Pain?  No/denies    Pain Score  0-No pain    Multiple Pain Sites  No                       [facility] Adult PT Treatment/Exercise - 10/21/17 0940      Neuro Re-ed    Neuro Re-ed Details   B staggered stance on foam with diagonal head turns in corner (in rhomberg position) x 30 sec each way      Knee/Hip Exercises: Stretches   Gastroc Stretch  Right;2 reps;30 seconds    Gastroc Stretch  Limitations  R only wall       Knee/Hip Exercises: Aerobic   Recumbent Bike  Lvl 2, 6 min       Knee/Hip Exercises: Machines for Strengthening   Cybex Knee Flexion  B con/R ecc: 20# x 10 reps     Cybex Leg Press  B LE's: 20# x 15 reps; R only 10# x 10 reps       Knee/Hip Exercises: Standing   Heel Raises  Both;15 reps    Heel Raises Limitations  B con/R ecc     Forward Lunges  Right;Left;10 reps Some cueing for positioning; tolerated well     Forward Lunges Limitations  TRX    Functional Squat  15 reps counter; cues for proper wt., distribution    SLS  R SLS x 20 sec  light UE support on counter; no difficulty     SLS with Vectors  R SLS with opposte LE cone toe-touch 2 x 20 sec; performed well       Modalities   Modalities  Vasopneumatic      Vasopneumatic   Number Minutes Vasopneumatic   10 minutes    Vasopnuematic Location   Knee    Vasopneumatic Pressure  Medium    Vasopneumatic Temperature   coldest temp.                   PT Long Term Goals - 10/21/17 0936      PT LONG TERM GOAL #1   Title  patient to be independent with advanced HEP    Status  On-going      PT LONG TERM GOAL #2   Title  patient to demonstrate R knee AROM 0-125    Status  On-going      PT LONG TERM GOAL #3   Title  patient to demsonstrate good quad control with no instability with eccentric step down    Status  On-going      PT LONG TERM GOAL #4   Title  patient to report ability to return to normal biking and kayaking    Status  On-going      PT LONG TERM GOAL #5   Title  patient to improve R knee strength to >/= 4+/5    Status  On-going            Plan - 10/21/17 0935    Clinical Impression Statement  Pt. doing well today.  Tolerated all LE strengthening therex well with addition of machine strengthening, SLS, and lunge activities well.  Reports no issues with HEP.  Ended with mild R knee discomfort thus ended session with ice/compression to R knee to decrease  post-exercise swelling and pain.    PT Treatment/Interventions  ADLs/Self Care Home Management;Cryotherapy;Electrical Stimulation;Therapeutic exercise;Therapeutic activities;Functional mobility training;Stair training;Gait training;Balance training;Neuromuscular re-education;Manual techniques;Vasopneumatic Device;Taping;Dry needling;Passive range of motion;Patient/family education    Consulted and Agree with Plan of Care  Patient       Patient will benefit from skilled therapeutic intervention in order to improve the following deficits and impairments:  Pain, Decreased strength, Decreased mobility, Decreased activity tolerance, Decreased range of motion  Visit Diagnosis: Acute pain of right knee  Stiffness of right knee, not elsewhere classified  Other symptoms and signs involving the musculoskeletal system     Problem List Patient Active Problem List   Diagnosis Date Noted  . Arthritis of knee 09/24/2017  . 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 Allergic rhinitis 03/20/2011  . Mild persistent asthma 03/20/2011    Bess Harvest, PTA 10/21/17 1:13 PM   [facility] [address]  [place], [place], [address] Phone: [phone]   Fax:  [phone]  Name: Isaac [name] MRN: [id number] Date of Birth: Feb 07, 1946

## 2017-10-23 ENCOUNTER — Ambulatory Visit: Payer: Medicare Other | Attending: Orthopedic Surgery

## 2017-10-23 DIAGNOSIS — R29898 Other symptoms and signs involving the musculoskeletal system: Secondary | ICD-10-CM | POA: Diagnosis present

## 2017-10-23 DIAGNOSIS — M25661 Stiffness of right knee, not elsewhere classified: Secondary | ICD-10-CM

## 2017-10-23 DIAGNOSIS — M25561 Pain in right knee: Secondary | ICD-10-CM | POA: Insufficient documentation

## 2017-10-23 NOTE — Therapy (Signed)
[facility] [address]  [place], [place], [address] Phone: [phone]   Fax:  [phone]  Physical Therapy Treatment  Patient Details  Name: Isaac [name] MRN: [id number] Date of Birth: 1946-04-21 Referring Provider: Dr. Marlou Sa   Encounter Date: 10/23/2017  PT End of Session - 10/23/17 1023    Visit Number  3    Number of Visits  8    Date for PT Re-Evaluation  11/12/17    PT Start Time  1018    PT Stop Time  1110    PT Time Calculation (min)  52 min    Activity Tolerance  Patient tolerated treatment well    Behavior During Therapy  [facility] for tasks assessed/performed       Past Medical History:  Diagnosis Date  . Anxiety   . Asthma   . Cancer ([place])    SKIN  . Coronary artery disease   . GERD (gastroesophageal reflux disease)   . Headache   . High triglycerides   . Myocardial infarction ([place])   . Pneumonia    2010    Past Surgical History:  Procedure Laterality Date  . APPENDECTOMY    . CHOLECYSTECTOMY    . COLON SURGERY    . CORONARY ANGIOPLASTY WITH STENT PLACEMENT    . PARTIAL KNEE ARTHROPLASTY Right 09/24/2017   Procedure: RIGHT UNICOMPARTMENTAL KNEE;  Surgeon: Meredith Pel, MD;  Location: [facility];  Service: Orthopedics;  Laterality: Right;  . TONSILLECTOMY      There were no vitals filed for this visit.      [facility] PT Assessment - 10/23/17 1030      Assessment   Next MD Visit  5.22.19                   [facility] Adult PT Treatment/Exercise - 10/23/17 1033      Knee/Hip Exercises: Stretches   Quad Stretch  Right;1 rep;60 seconds    Quad Stretch Limitations  prone with bolster under thigh; with strap    Hip Flexor Stretch  Right;30 seconds    Hip Flexor Stretch Limitations  mod thomas - pt. prefering this over prone quad stretch       Knee/Hip Exercises: Standing   Hip Flexion  Right;Left;10 reps;Knee straight    Hip Flexion Limitations  red TB at ankle; 2 ski poles     Hip ADduction  Right;Left;10 reps    Hip ADduction Limitations  red TB at ankle; 2 ski poles     Hip Abduction  Right;Left;10 reps;Knee straight    Abduction Limitations  red TB at ankle; 2 ski poles     Hip Extension  Right;Left;10 reps;Knee straight    Extension Limitations  red TB at ankle; 2 ski poles     Forward Step Up  Right;10 reps;Hand Hold: 1;Step Height: 6"    Forward Step Up Limitations  1 ski pole    Step Down  Right;10 reps;Step Height: 4";Hand Hold: 1    Step Down Limitations  1 ski poles     Wall Squat  10 reps;3 seconds    Wall Squat Limitations  cueing x 1 for even wt. distribution       Modalities   Modalities  Vasopneumatic      Vasopneumatic   Number Minutes Vasopneumatic   10 minutes    Vasopnuematic Location   Knee    Vasopneumatic Pressure  Medium    Vasopneumatic Temperature   coldest  temp.       Manual Therapy   Manual Therapy  Joint mobilization;Soft tissue mobilization    Manual therapy comments  Supine     Joint Mobilization  R patellar mobs all directions; good mobility; R knee A/P mobs; good mobility     Soft tissue mobilization  R knee scar massage              PT Education - 10/23/17 1123    Education provided  Yes    Education Details  HEP update    Person(s) Educated  Patient    Methods  Explanation;Demonstration;Verbal cues;Handout    Comprehension  Verbalized understanding;Returned demonstration;Verbal cues required;Need further instruction          PT Long Term Goals - 10/21/17 0936      PT LONG TERM GOAL #1   Title  patient to be independent with advanced HEP    Status  On-going      PT LONG TERM GOAL #2   Title  patient to demonstrate R knee AROM 0-125    Status  On-going      PT LONG TERM GOAL #3   Title  patient to demsonstrate good quad control with no instability with eccentric step down    Status  On-going      PT LONG TERM GOAL #4   Title  patient to report ability to return to normal biking and kayaking     Status  On-going      PT LONG TERM GOAL #5   Title  patient to improve R knee strength to >/= 4+/5    Status  On-going            Plan - 10/23/17 1030    Clinical Impression Statement  Isaac [name] doing well today.  Felt fine after last visit.  Tolerated addition of 4-way hip kicker with red TB, forward/back stepping, and wall squat well today.  Only requiring occasional technique correction with therex positioning.  Ended treatment with ice/compression to R knee to decrease post-exercise swelling and pain.    PT Treatment/Interventions  ADLs/Self Care Home Management;Cryotherapy;Electrical Stimulation;Therapeutic exercise;Therapeutic activities;Functional mobility training;Stair training;Gait training;Balance training;Neuromuscular re-education;Manual techniques;Vasopneumatic Device;Taping;Dry needling;Passive range of motion;Patient/family education    Consulted and Agree with Plan of Care  Patient       Patient will benefit from skilled therapeutic intervention in order to improve the following deficits and impairments:  Pain, Decreased strength, Decreased mobility, Decreased activity tolerance, Decreased range of motion  Visit Diagnosis: Acute pain of right knee  Stiffness of right knee, not elsewhere classified  Other symptoms and signs involving the musculoskeletal system     Problem List Patient Active Problem List   Diagnosis Date Noted  . Arthritis of knee 09/24/2017  . 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 Allergic rhinitis 03/20/2011  . Mild persistent asthma 03/20/2011   Isaac [name], PTA 10/23/17 12:06 PM  [facility] [address]  [place], [place], [address] Phone: [phone]   Fax:  [phone]  Name: Isaac [name] MRN: [id number] Date of Birth: [birth year]

## 2017-10-28 ENCOUNTER — Ambulatory Visit: Payer: Medicare Other

## 2017-10-28 DIAGNOSIS — R29898 Other symptoms and signs involving the musculoskeletal system: Secondary | ICD-10-CM

## 2017-10-28 DIAGNOSIS — M25561 Pain in right knee: Secondary | ICD-10-CM | POA: Diagnosis not present

## 2017-10-28 DIAGNOSIS — M25661 Stiffness of right knee, not elsewhere classified: Secondary | ICD-10-CM

## 2017-10-28 NOTE — Therapy (Signed)
[facility] [address]  [place], [place], [address] Phone: [phone]   Fax:  [phone]  Physical Therapy Treatment  Patient Details  Name: Isaac [name] MRN: [id number] Date of Birth: [birth year] Referring Provider: Dr. Marlou Sa   Encounter Date: 10/28/2017  PT End of Session - 10/28/17 0939    Visit Number  4    Number of Visits  8    Date for PT Re-Evaluation  11/12/17    PT Start Time  0934    PT Stop Time  1035    PT Time Calculation (min)  61 min    Activity Tolerance  Patient tolerated treatment well    Behavior During Therapy  [facility] for tasks assessed/performed       Past Medical History:  Diagnosis Date  . Anxiety   . Asthma   . Cancer ([place])    SKIN  . Coronary artery disease   . GERD (gastroesophageal reflux disease)   . Headache   . High triglycerides   . Myocardial infarction ([place])   . Pneumonia    2010    Past Surgical History:  Procedure Laterality Date  . APPENDECTOMY    . CHOLECYSTECTOMY    . COLON SURGERY    . CORONARY ANGIOPLASTY WITH STENT PLACEMENT    . PARTIAL KNEE ARTHROPLASTY Right 09/24/2017   Procedure: RIGHT UNICOMPARTMENTAL KNEE;  Surgeon: Meredith Pel, MD;  Location: [place];  Service: Orthopedics;  Laterality: Right;  . TONSILLECTOMY      There were no vitals filed for this visit.  Subjective Assessment - 10/28/17 0938    Subjective  Pt. doing well today.  Felt fine after last visit.      Patient Stated Goals  get back to biking and kayaking    Currently in Pain?  No/denies    Pain Score  0-No pain    Multiple Pain Sites  No                       [facility] Adult PT Treatment/Exercise - 10/28/17 0941      Knee/Hip Exercises: Stretches   Knee: Self-Stretch to increase Flexion  5 reps;10 seconds;Right    Knee: Self-Stretch Limitations  on low machine seat      Knee/Hip Exercises: Aerobic   Elliptical  lvl 1.0, 4 min       Knee/Hip Exercises:  Machines for Strengthening   Cybex Knee Extension  B con/R ecc 15# x 15 reps     Cybex Knee Flexion  B LE's: 35# x 10; B con/R ecc 20# x 15 reps       Knee/Hip Exercises: Standing   Terminal Knee Extension  Right;Strengthening;Theraband;15 reps    Theraband Level (Terminal Knee Extension)  Level 3 (Green)    Lateral Step Up  Right;10 reps;Step Height: 8";Hand Hold: 2    Lateral Step Up Limitations  2 ski poles     Functional Squat  15 reps Cues for proper technique     Functional Squat Limitations  TRX + heel raise at bottom of movement     Wall Squat  3 seconds x 12 reps    Wall Squat Limitations  leaning on orange p-ball on wall     Stairs  x 14 in stair well good stability and eccentric R quad control with 1 rail    SLS with Vectors  R SLS on foam with opposte LE cone toe-touch 2 x 30  sec; light UE support on counter  Verbalized some difficulty       Knee/Hip Exercises: Seated   Long Arc Quad  Strengthening;Right;15 reps    Long Arc Quad Weight  2 lbs.      Modalities   Modalities  Vasopneumatic      Vasopneumatic   Number Minutes Vasopneumatic   10 minutes    Vasopnuematic Location   Knee    Vasopneumatic Pressure  Medium    Vasopneumatic Temperature   coldest temp.       Manual Therapy   Manual Therapy  Soft tissue mobilization    Manual therapy comments  Supine     Soft tissue mobilization  R knee scar massage              PT Education - 10/28/17 1033    Education provided  Yes    Education Details  HEP update    Person(s) Educated  Patient    Methods  Explanation;Demonstration;Verbal cues;Handout    Comprehension  Verbalized understanding;Returned demonstration;Verbal cues required;Need further instruction          PT Long Term Goals - 10/21/17 0936      PT LONG TERM GOAL #1   Title  patient to be independent with advanced HEP    Status  On-going      PT LONG TERM GOAL #2   Title  patient to demonstrate R knee AROM 0-125    Status  On-going       PT LONG TERM GOAL #3   Title  patient to demsonstrate good quad control with no instability with eccentric step down    Status  On-going      PT LONG TERM GOAL #4   Title  patient to report ability to return to normal biking and kayaking    Status  On-going      PT LONG TERM GOAL #5   Title  patient to improve R knee strength to >/= 4+/5    Status  On-going            Plan - 10/28/17 0941    Clinical Impression Statement  Pt. reporting he felt fine after last visit without muscular soreness.  Seems to be responding well to all strengthening therex.  Tolerated addition of SLS on foam with vectors, quad extension machine, and lateral step up well today.  Able to demo good quad stability ascending/descending stairs x 14 today however still some limited eccentric control with step-down per last visit.  HEP updated today.  Most difficulty with SLS activities on foam today.  Able to demo ~ 125 dg AROM flexion with weight bearing lunge stretch today.  Pt. progressing well.      PT Treatment/Interventions  ADLs/Self Care Home Management;Cryotherapy;Electrical Stimulation;Therapeutic exercise;Therapeutic activities;Functional mobility training;Stair training;Gait training;Balance training;Neuromuscular re-education;Manual techniques;Vasopneumatic Device;Taping;Dry needling;Passive range of motion;Patient/family education    Consulted and Agree with Plan of Care  Patient       Patient will benefit from skilled therapeutic intervention in order to improve the following deficits and impairments:  Pain, Decreased strength, Decreased mobility, Decreased activity tolerance, Decreased range of motion  Visit Diagnosis: Acute pain of right knee  Stiffness of right knee, not elsewhere classified  Other symptoms and signs involving the musculoskeletal system     Problem List Patient Active Problem List   Diagnosis Date Noted  . Arthritis of knee 09/24/2017  . Allergy with anaphylaxis due to  food 02/16/2015  . Allergic rhinitis 03/20/2011  .  Mild persistent asthma 03/20/2011    Bess Harvest, PTA 10/28/17 10:55 AM  [facility] [address]  [place], [place], [address] Phone: [phone]   Fax:  [phone]  Name: Isaac [name] MRN: [id number] Date of Birth: 1946/01/02

## 2017-10-30 ENCOUNTER — Encounter: Payer: Self-pay | Admitting: Allergy & Immunology

## 2017-10-30 ENCOUNTER — Ambulatory Visit: Payer: Medicare Other | Admitting: Physical Therapy

## 2017-10-30 ENCOUNTER — Encounter: Payer: Self-pay | Admitting: Physical Therapy

## 2017-10-30 ENCOUNTER — Ambulatory Visit (INDEPENDENT_AMBULATORY_CARE_PROVIDER_SITE_OTHER): Payer: Medicare Other | Admitting: Allergy & Immunology

## 2017-10-30 VITALS — BP 106/60 | HR 72 | Temp 97.8°F | Resp 16

## 2017-10-30 DIAGNOSIS — J4520 Mild intermittent asthma, uncomplicated: Secondary | ICD-10-CM | POA: Diagnosis not present

## 2017-10-30 DIAGNOSIS — M25661 Stiffness of right knee, not elsewhere classified: Secondary | ICD-10-CM

## 2017-10-30 DIAGNOSIS — M25561 Pain in right knee: Secondary | ICD-10-CM | POA: Diagnosis not present

## 2017-10-30 DIAGNOSIS — J301 Allergic rhinitis due to pollen: Secondary | ICD-10-CM | POA: Diagnosis not present

## 2017-10-30 DIAGNOSIS — R29898 Other symptoms and signs involving the musculoskeletal system: Secondary | ICD-10-CM

## 2017-10-30 DIAGNOSIS — T7800XD Anaphylactic reaction due to unspecified food, subsequent encounter: Secondary | ICD-10-CM

## 2017-10-30 MED ORDER — ALBUTEROL SULFATE HFA 108 (90 BASE) MCG/ACT IN AERS: 2.0000 | INHALATION_SPRAY | RESPIRATORY_TRACT | 1 refills | Status: DC | PRN

## 2017-10-30 NOTE — Patient Instructions (Signed)
1. Allergic rhinitis - finished five years of allergy shots - Continue with Atrovent as needed. - Continue with Claritin daily as needed.  2. Mild persistent asthma, uncomplicated - Lung function looked better today. - Daily controller medication(s): none - Rescue medications: albuterol 4 puffs every 4-6 hours as needed - Changes during respiratory infections or worsening symptoms: restart Qvar 24mcg to 2 puffs once in the morning and once at night for TWO WEEKS. - Asthma control goals:  * Full participation in all desired activities (may need albuterol before activity) * Albuterol use two time or less a week on average (not counting use with activity) * Cough interfering with sleep two time or less a month * Oral steroids no more than once a year * No hospitalizations  3. Allergy with anaphylaxis due to food - Continue to avoid trigger foods (watermelon). - Patient declined an EpiPen. Call us if you want to have an EpiPen  4. Follow up in 1 year or sooner as needed. Call me if this treatment plan is not working for you Please inform us of any Emergency Department visits, hospitalizations, or changes in symptoms. Call us before going to the ED for breathing or allergy symptoms since we might be able to fit you in for a sick visit. Feel free to contact us anytime with any questions, problems, or concerns.  It was a pleasure to meet today!   Websites that have reliable patient information: 1. American Academy of Asthma, Allergy, and Immunology: www.aaaai.org 2. Food Allergy Research and Education (FARE): foodallergy.org 3. Mothers of Asthmatics: http://www.asthmacommunitynetwork.org 4. American College of Allergy, Asthma, and Immunology: www.acaai.org

## 2017-10-30 NOTE — Progress Notes (Signed)
[place] [address] Dept: [phone]  FOLLOW UP NOTE  Patient ID: Isaac [name], male    DOB: [birth year]  Age: 72 y.o. MRN: [id number] Date of Office Visit: 10/30/2017  Assessment  Chief Complaint: Allergic Rhinitis  and Asthma  HPI Isaac [name] is a 72 year old male who presents to the clinic for a follow up visit. He was last seen in this clinic on 10/04/2016 by Dr. Ernst Bowler for evaluation of allergic rhinitis, asthma, and food allergy. At that time, his asthma was well controlled and he continued on ProAir and Qvar for asthma flares which he reports only occur when he is sick which is infrequently. At that time, he had completed 5 years of allergen immunotherapy and maintained good control of allergic rhinitis with Atrovent and Claritin.   At today's visit, he reports he has been doing well overall.  Isaac [name]'s asthma has been well controlled. He has not required rescue medication, experienced nocturnal awakenings due to lower respiratory symptoms, nor have activities of daily living been limited. He has required no Emergency Department or Urgent Care visits for his asthma. He has required zero courses of systemic steroids for asthma exacerbations since the last visit. ACT score today is 25, indicating excellent asthma symptom control. He uses ProAir during asthma flares and has not needed this medication since his last visit here.  Allergic rhinitis is reported as well controlled with Atrovent nasal spray once a day and Claritin once a day.   He is currently avoiding watermelon and has not had any accidental ingestion. He has had an EpiPen in the past, however, he does not feel any need to have an EpiPen at this time. He will continue to avoid watermelon.   His current medications are listed in the chart.   Drug Allergies:  Allergies  Allergen Reactions  . Other     Fruits/pears/green peppers - diarrhea   . Watermelon [Citrullus Vulgaris] Swelling    Throat swelling    . Penicillins Rash    Has patient had a PCN reaction causing immediate rash, facial/tongue/throat swelling, SOB or lightheadedness with hypotension: No Has patient had a PCN reaction causing severe rash involving mucus membranes or skin necrosis: No Has patient had a PCN reaction that required hospitalization: No Has patient had a PCN reaction occurring within the last 10 years: No If all of the above answers are "NO", then may proceed with Cephalosporin use.     Physical Exam: BP 106/60 (BP Location: Left Arm, Patient Position: Sitting, Cuff Size: Normal)   Pulse 72   Temp 97.8 F (36.6 C) (Oral)   Resp 16   SpO2 95%    Physical Exam  Constitutional: He is oriented to person, place, and time. He appears well-developed and well-nourished.  HENT:  Head: Normocephalic.  Right Ear: External ear normal.  Left Ear: External ear normal.  Bilateral nares slightly erythematous with no edema or nasal drainage noted. Pharynx with small left sided tonsillarlith. Eyes normal. Ears normal.  Eyes: Conjunctivae are normal.  Neck: Normal range of motion. Neck supple.  Cardiovascular: Normal rate, regular rhythm and normal heart sounds.  No murmur noted  Pulmonary/Chest: Effort normal and breath sounds normal.  Lungs clear to auscultation  Musculoskeletal: Normal range of motion.  Neurological: He is alert and oriented to person, place, and time.  Skin: Skin is warm and dry.  Psychiatric: He has a normal mood and affect. His behavior is normal. Judgment and thought content normal.  Diagnostics: FVC  2.96, FEV1 2.46. Predicted FVC 4.04, predicted FEV1 2.94. Spirometry reveals mild restriction. This is consistent with previous spirometry.   Assessment and Plan: 1. Mild intermittent asthma without complication   2. Allergy with anaphylaxis due to food, subsequent encounter   3. Seasonal allergic rhinitis due to pollen     Meds ordered this encounter  Medications  . albuterol (PROAIR  HFA) 108 (90 Base) MCG/ACT inhaler    Sig: Inhale 2 puffs into the lungs every 4 (four) hours as needed for wheezing or shortness of breath.    Dispense:  1 Inhaler    Refill:  1   1. Allergic rhinitis - finished five years of allergy shots - Continue with Atrovent as needed. - Continue with Claritin daily as needed.  2. Mild persistent asthma, uncomplicated - Lung function looked better today. - Daily controller medication(s): none - Rescue medications: albuterol 4 puffs every 4-6 hours as needed - Changes during respiratory infections or worsening symptoms: restart Qvar 61mcg to 2 puffs once in the morning and once at night for TWO WEEKS. - Asthma control goals:  * Full participation in all desired activities (may need albuterol before activity) * Albuterol use two time or less a week on average (not counting use with activity) * Cough interfering with sleep two time or less a month * Oral steroids no more than once a year * No hospitalizations  3. Allergy with anaphylaxis due to food - Continue to avoid trigger foods (watermelon). - Patient declined an EpiPen. Call us if you want to have an EpiPen  4. Follow up in 1 year or sooner as needed. Call me if this treatment plan is not working for you   Return in about 1 year (around 10/31/2018), or if symptoms worsen or fail to improve.    Thank you for the opportunity to care for this patient.  Please do not hesitate to contact me with questions.  Gareth Morgan, FNP [facility] [place] [facility]

## 2017-10-30 NOTE — Therapy (Signed)
[facility] [address]  [place], [place], [address] Phone: [phone]   Fax:  [phone]  Physical Therapy Treatment  Patient Details  Name: Isaac [name] MRN: [id number] Date of Birth: Jan 14, 1946 Referring Provider: Dr. Marlou Sa   Encounter Date: 10/30/2017  PT End of Session - 10/30/17 1020    Visit Number  5    Number of Visits  8    Date for PT Re-Evaluation  11/12/17    PT Start Time  1019    PT Stop Time  1101    PT Time Calculation (min)  42 min    Activity Tolerance  Patient tolerated treatment well    Behavior During Therapy  [facility] for tasks assessed/performed       Past Medical History:  Diagnosis Date  . Anxiety   . Asthma   . Cancer ([place])    SKIN  . Coronary artery disease   . GERD (gastroesophageal reflux disease)   . Headache   . High triglycerides   . Myocardial infarction ([place])   . Pneumonia    2010    Past Surgical History:  Procedure Laterality Date  . APPENDECTOMY    . CHOLECYSTECTOMY    . COLON SURGERY    . CORONARY ANGIOPLASTY WITH STENT PLACEMENT    . PARTIAL KNEE ARTHROPLASTY Right 09/24/2017   Procedure: RIGHT UNICOMPARTMENTAL KNEE;  Surgeon: Meredith Pel, MD;  Location: [facility];  Service: Orthopedics;  Laterality: Right;  . TONSILLECTOMY      There were no vitals filed for this visit.  Subjective Assessment - 10/30/17 1020    Subjective  Doing well - doesn't think knee is hindering him right now    Patient Stated Goals  get back to biking and kayaking    Currently in Pain?  No/denies    Pain Score  0-No pain                       [facility] Adult PT Treatment/Exercise - 10/30/17 1018      Knee/Hip Exercises: Stretches   Gastroc Stretch  Right;3 reps;30 seconds    Gastroc Stretch Limitations  prostretch      Knee/Hip Exercises: Aerobic   Elliptical  L2 x 6 min      Knee/Hip Exercises: Machines for Strengthening   Cybex Knee Extension  B LE - 25# 2 x 15       Knee/Hip Exercises: Standing   Forward Lunges  Right;Left;10 reps fwd/bwd alternating    Forward Lunges Limitations  1 UE support    Step Down  Right;2 sets;10 reps;Hand Hold: 2;Step Height: 4"    Functional Squat  15 reps    Functional Squat Limitations  B UE support at chair      Knee/Hip Exercises: Seated   Long Arc Quad  Strengthening;Right;15 reps;Weights + ball squeeze    Long Arc Quad Weight  3 lbs.    Hamstring Curl  Strengthening;Right;15 reps;Weights    Hamstring Limitations  green tband     Hamstring Weights  3 lbs.      Knee/Hip Exercises: Supine   Straight Leg Raises  Strengthening;Right;15 reps 3#    Straight Leg Raise with External Rotation  Strengthening;Right;15 reps 3#      Manual Therapy   Manual Therapy  Joint mobilization    Manual therapy comments  supine    Joint Mobilization  R patellar mobs - all planes  PT Long Term Goals - 10/21/17 0936      PT LONG TERM GOAL #1   Title  patient to be independent with advanced HEP    Status  On-going      PT LONG TERM GOAL #2   Title  patient to demonstrate R knee AROM 0-125    Status  On-going      PT LONG TERM GOAL #3   Title  patient to demsonstrate good quad control with no instability with eccentric step down    Status  On-going      PT LONG TERM GOAL #4   Title  patient to report ability to return to normal biking and kayaking    Status  On-going      PT LONG TERM GOAL #5   Title  patient to improve R knee strength to >/= 4+/5    Status  On-going            Plan - 10/30/17 1021    Clinical Impression Statement  Isaac [name] doing really well with progression of PT. Able to tolerate all strengthening and ROM activities without issue. Some verbal cueing needed to slow movements for desired motor control and form of activity. Will likely be able to wrap up POC as originally outlined. Making excellent progress towards goals.     PT Treatment/Interventions  ADLs/Self Care  Home Management;Cryotherapy;Electrical Stimulation;Therapeutic exercise;Therapeutic activities;Functional mobility training;Stair training;Gait training;Balance training;Neuromuscular re-education;Manual techniques;Vasopneumatic Device;Taping;Dry needling;Passive range of motion;Patient/family education    Consulted and Agree with Plan of Care  Patient       Patient will benefit from skilled therapeutic intervention in order to improve the following deficits and impairments:  Pain, Decreased strength, Decreased mobility, Decreased activity tolerance, Decreased range of motion  Visit Diagnosis: Acute pain of right knee  Stiffness of right knee, not elsewhere classified  Other symptoms and signs involving the musculoskeletal system     Problem List Patient Active Problem List   Diagnosis Date Noted  . Mild intermittent asthma without complication 95/63/8756  . Seasonal allergic rhinitis due to pollen 10/30/2017  . Arthritis of knee 09/24/2017  . Allergy with anaphylaxis due to food, subsequent encounter 02/16/2015  . Allergic rhinitis 03/20/2011  . Mild persistent asthma 03/20/2011     Lanney Gins, PT, DPT 10/30/17 1:13 PM   [facility] [address]  [place], [place], [address] Phone: [phone]   Fax:  [phone]  Name: Isaac [name] MRN: [id number] Date of Birth: February 17, 1946

## 2017-11-03 ENCOUNTER — Other Ambulatory Visit: Payer: Self-pay

## 2017-11-03 MED ORDER — ALBUTEROL SULFATE HFA 108 (90 BASE) MCG/ACT IN AERS: 2.0000 | INHALATION_SPRAY | RESPIRATORY_TRACT | 1 refills | Status: DC | PRN

## 2017-11-04 ENCOUNTER — Encounter: Payer: Self-pay | Admitting: Physical Therapy

## 2017-11-04 ENCOUNTER — Ambulatory Visit: Payer: Medicare Other | Admitting: Physical Therapy

## 2017-11-04 DIAGNOSIS — M25661 Stiffness of right knee, not elsewhere classified: Secondary | ICD-10-CM

## 2017-11-04 DIAGNOSIS — M25561 Pain in right knee: Secondary | ICD-10-CM | POA: Diagnosis not present

## 2017-11-04 DIAGNOSIS — R29898 Other symptoms and signs involving the musculoskeletal system: Secondary | ICD-10-CM

## 2017-11-04 NOTE — Therapy (Signed)
[facility] [address]  [place], [place], [address] Phone: [phone]   Fax:  [phone]  Physical Therapy Treatment  Patient Details  Name: Isaac [name] MRN: [id number] Date of Birth: 1946-01-01 Referring Provider: Dr. Marlou Sa   Encounter Date: 11/04/2017  PT End of Session - 11/04/17 1021    Visit Number  6    Number of Visits  8    Date for PT Re-Evaluation  11/12/17    PT Start Time  0935    PT Stop Time  1017    PT Time Calculation (min)  42 min    Activity Tolerance  Patient tolerated treatment well    Behavior During Therapy  [facility] for tasks assessed/performed       Past Medical History:  Diagnosis Date  . Anxiety   . Asthma   . Cancer ([place])    SKIN  . Coronary artery disease   . GERD (gastroesophageal reflux disease)   . Headache   . High triglycerides   . Myocardial infarction ([place])   . Pneumonia    2010    Past Surgical History:  Procedure Laterality Date  . APPENDECTOMY    . CHOLECYSTECTOMY    . COLON SURGERY    . CORONARY ANGIOPLASTY WITH STENT PLACEMENT    . PARTIAL KNEE ARTHROPLASTY Right 09/24/2017   Procedure: RIGHT UNICOMPARTMENTAL KNEE;  Surgeon: Meredith Pel, MD;  Location: [place];  Service: Orthopedics;  Laterality: Right;  . TONSILLECTOMY      There were no vitals filed for this visit.  Subjective Assessment - 11/04/17 0937    Subjective  States knee is pretty good. Have been walking a few miles over the weekend and the knee has held up pretty well throughout all of that.    Patient Stated Goals  get back to biking and kayaking    Currently in Pain?  Yes    Pain Score  1     Pain Location  Knee    Pain Orientation  Right    Pain Descriptors / Indicators  [facility] Adult PT Treatment/Exercise - 11/04/17 0350      Knee/Hip Exercises: Stretches   Gastroc Stretch  20 seconds;2 reps;Both    Production assistant, radio;  at counter top      Knee/Hip Exercises: Aerobic   Recumbent Bike  Lvl 2, 6 min       Knee/Hip Exercises: Machines for Strengthening   Cybex Knee Extension  B LE - 25# 2 x 15    Cybex Knee Flexion  B LE's: 35# x 10; B con/R ecc 20# x 15 reps       Knee/Hip Exercises: Standing   Forward Lunges  Right;Left;10 reps    Forward Lunges Limitations  -- CGA for balance    Lateral Step Up  Right;10 reps;Hand Hold: 0;Step Height: 6"    Lateral Step Up Limitations  VCs to decrease speed    Forward Step Up  Right;Hand Hold: 0;Step Height: 6";10 reps    Forward Step Up Limitations  VCs to decreased speed    Functional Squat  15 reps    Functional Squat Limitations  B UE support at counter top    SLS with Vectors  SLS with mini squat reaching to multidirectional cones; each LE 10x each LE CGA for balance  PT Long Term Goals - 10/21/17 0936      PT LONG TERM GOAL #1   Title  patient to be independent with advanced HEP    Status  On-going      PT LONG TERM GOAL #2   Title  patient to demonstrate R knee AROM 0-125    Status  On-going      PT LONG TERM GOAL #3   Title  patient to demsonstrate good quad control with no instability with eccentric step down    Status  On-going      PT LONG TERM GOAL #4   Title  patient to report ability to return to normal biking and kayaking    Status  On-going      PT LONG TERM GOAL #5   Title  patient to improve R knee strength to >/= 4+/5    Status  On-going            Plan - 11/04/17 0950    Clinical Impression Statement  Patient arrived to session with report that he had been active throughout the weekend and R knee had not been bothering him. Able to perform progressive LE strengthening and balance training this date with good form and no c/o pain. CGA required during balance exercises. Patient reports that he will be ready to wrap up with PT within the next couple of visits d/t feeling like he is able to perform just  about everything he needs to do with minimal pain. Notes he is in much less pain now compared to pre-surgery. Inquired about appropriateness of HEP- patient reported SLR has gotten easy. Advised patient to try using <5# ankle weight that he owns from home to progress this exercise. Patient reported understanding. Will plan to D/C and update HEP next couple visits.     PT Treatment/Interventions  ADLs/Self Care Home Management;Cryotherapy;Electrical Stimulation;Therapeutic exercise;Therapeutic activities;Functional mobility training;Stair training;Gait training;Balance training;Neuromuscular re-education;Manual techniques;Vasopneumatic Device;Taping;Dry needling;Passive range of motion;Patient/family education    PT Next Visit Plan  Reassess and progress HEP; possible D/C next visit    Consulted and Agree with Plan of Care  Patient       Patient will benefit from skilled therapeutic intervention in order to improve the following deficits and impairments:  Pain, Decreased strength, Decreased mobility, Decreased activity tolerance, Decreased range of motion  Visit Diagnosis: Acute pain of right knee  Stiffness of right knee, not elsewhere classified  Other symptoms and signs involving the musculoskeletal system     Problem List Patient Active Problem List   Diagnosis Date Noted  . Mild intermittent asthma without complication 47/82/9562  . Seasonal allergic rhinitis due to pollen 10/30/2017  . Arthritis of knee 09/24/2017  . Allergy with anaphylaxis due to food, subsequent encounter 02/16/2015  . Allergic rhinitis 03/20/2011  . Mild persistent asthma 03/20/2011    Janene Harvey, PT, DPT 11/04/17 10:27 AM   [facility] [address]  [place], [place], [address] Phone: [phone]   Fax:  [phone]  Name: Isaac [name] MRN: [id number] Date of Birth: 30-May-1946

## 2017-11-06 ENCOUNTER — Ambulatory Visit: Payer: Medicare Other

## 2017-11-06 DIAGNOSIS — M25561 Pain in right knee: Secondary | ICD-10-CM | POA: Diagnosis not present

## 2017-11-06 DIAGNOSIS — R29898 Other symptoms and signs involving the musculoskeletal system: Secondary | ICD-10-CM

## 2017-11-06 DIAGNOSIS — M25661 Stiffness of right knee, not elsewhere classified: Secondary | ICD-10-CM

## 2017-11-06 NOTE — Therapy (Addendum)
[facility] [address]  [place], [place], [address] Phone: [phone]   Fax:  [phone]  Physical Therapy Treatment  Patient Details  Name: Isaac [name] MRN: [id number] Date of Birth: 05/25/1946 Referring Provider: Dr. Marlou Sa   Encounter Date: 11/06/2017  PT End of Session - 11/06/17 0937    Visit Number  7    Number of Visits  8    Date for PT Re-Evaluation  11/12/17    PT Start Time  0934    PT Stop Time  1017    PT Time Calculation (min)  43 min    Activity Tolerance  Patient tolerated treatment well    Behavior During Therapy  [facility] for tasks assessed/performed       Past Medical History:  Diagnosis Date  . Anxiety   . Asthma   . Cancer ([place])    SKIN  . Coronary artery disease   . GERD (gastroesophageal reflux disease)   . Headache   . High triglycerides   . Myocardial infarction ([place])   . Pneumonia    2010    Past Surgical History:  Procedure Laterality Date  . APPENDECTOMY    . CHOLECYSTECTOMY    . COLON SURGERY    . CORONARY ANGIOPLASTY WITH STENT PLACEMENT    . PARTIAL KNEE ARTHROPLASTY Right 09/24/2017   Procedure: RIGHT UNICOMPARTMENTAL KNEE;  Surgeon: Meredith Pel, MD;  Location: [facility];  Service: Orthopedics;  Laterality: Right;  . TONSILLECTOMY      There were no vitals filed for this visit.  Subjective Assessment - 11/06/17 0937    Subjective  Pt. reporting he feels comfortable transitioning to home program following next visit.      Patient Stated Goals  get back to biking and kayaking    Currently in Pain?  No/denies    Pain Score  0-No pain    Multiple Pain Sites  No                       [facility] Adult PT Treatment/Exercise - 11/06/17 0944      Self-Care   Self-Care  Other Self-Care Comments    Other Self-Care Comments   reviewed comprehensive HEP: [name] pt. to cut back to 2-3/ week on previously issued HEP activities; pt. is walking 2-3 miles  regularly and performing upright stationary bike at home ~ 20 min throughout week; pt. encouraged to utilize local gym for HEP updated machine strengthening for LE's strengthening; pt. verbalized understanding of this plan       Knee/Hip Exercises: Stretches   Gastroc Stretch  20 seconds;2 reps;Right    Gastroc Stretch Limitations  prostretch; at counter top      Knee/Hip Exercises: Aerobic   Elliptical  L2 x 6 min      Knee/Hip Exercises: Machines for Strengthening   Cybex Knee Extension  B LE - 25#  x 20 reps     Cybex Knee Flexion  B con/R ecc 20# 2 x 15 reps     Cybex Leg Press  B LE's: 25# x 15 reps       Knee/Hip Exercises: Standing   Step Down  Right;10 reps;Hand Hold: 2;1 set;Step Height: 6" machine hold     Other Standing Knee Exercises  B side stepping, monster walk with red band at ankles 2 x 30 ft       Knee/Hip Exercises: Supine   Straight Leg Raises  Strengthening;Right;20  reps             PT Education - 11/06/17 1030    Education provided  Yes    Education Details  HEP update; 4-way hip kicker updated with green TB issued to pt.; side stepping with red TB issued to pt.     Person(s) Educated  Patient    Methods  Explanation;Demonstration;Verbal cues;Handout    Comprehension  Verbalized understanding;Returned demonstration;Verbal cues required;Need further instruction          PT Long Term Goals - 10/21/17 0936      PT LONG TERM GOAL #1   Title  patient to be independent with advanced HEP    Status  On-going      PT LONG TERM GOAL #2   Title  patient to demonstrate R knee AROM 0-125    Status  On-going      PT LONG TERM GOAL #3   Title  patient to demsonstrate good quad control with no instability with eccentric step down    Status  On-going      PT LONG TERM GOAL #4   Title  patient to report ability to return to normal biking and kayaking    Status  On-going      PT LONG TERM GOAL #5   Title  patient to improve R knee strength to >/= 4+/5     Status  On-going            Plan - 11/06/17 0939    Clinical Impression Statement  Isaac [name] has made excellent progress with therapy.  Has not been having recent R knee pain just with complaint of "typical stiffness" in mornings.  Verbalized today that he does feel comfortable transitioning to home program following next visit.  Session focused on HEP update with band resistance updated, and review of machine LE strengthening to be performed at local gym.  Isaac [name] to cut back on performance of previously issued HEP activities to 2-3/week.  Pt. reporting he is regularly using stationary bike at home and walking 2-3 miles a few times a week without issue.  Isaac [name] tolerated all LE strengthening activities performed in treatment well today only requiring minor cueing for posterior wt. shift with eccentric step-down.  Ended treatment pain free.  Will plan for final goal testing and final HEP review at upcoming visit in preparation for pt. f/u with MD in afternoon on 5.22.      PT Treatment/Interventions  ADLs/Self Care Home Management;Cryotherapy;Electrical Stimulation;Therapeutic exercise;Therapeutic activities;Functional mobility training;Stair training;Gait training;Balance training;Neuromuscular re-education;Manual techniques;Vasopneumatic Device;Taping;Dry needling;Passive range of motion;Patient/family education    PT Next Visit Plan  Transition to home program     Consulted and Agree with Plan of Care  Patient       Patient will benefit from skilled therapeutic intervention in order to improve the following deficits and impairments:  Pain, Decreased strength, Decreased mobility, Decreased activity tolerance, Decreased range of motion  Visit Diagnosis: Acute pain of right knee  Stiffness of right knee, not elsewhere classified  Other symptoms and signs involving the musculoskeletal system     Problem List Patient Active Problem List   Diagnosis Date Noted  . Mild intermittent  asthma without complication 57/84/6962  . Seasonal allergic rhinitis due to pollen 10/30/2017  . Arthritis of knee 09/24/2017  . Allergy with anaphylaxis due to food, subsequent encounter 02/16/2015  . Allergic rhinitis 03/20/2011  . Mild persistent asthma 03/20/2011    Bess Harvest, PTA 11/06/17 10:45 AM  [facility] [address]  [place], [place], [address] Phone: [phone]   Fax:  [phone]  Name: Isaac [name] MRN: [id number] Date of Birth: 1945/08/13   PHYSICAL THERAPY DISCHARGE SUMMARY  Visits from Start of Care: 7  Current functional level related to goals / functional outcomes: See above   Remaining deficits: Not measured- patient did not return for subsequent visits   Education / Equipment: See above Plan: Patient agrees to discharge.  Patient goals were not met. Patient is being discharged due to not returning since the last visit.  ?????     Janene Harvey, PT, DPT 12/09/17 12:46 PM

## 2017-11-10 ENCOUNTER — Ambulatory Visit (INDEPENDENT_AMBULATORY_CARE_PROVIDER_SITE_OTHER): Payer: Medicare Other | Admitting: Orthopedic Surgery

## 2017-11-12 ENCOUNTER — Ambulatory Visit: Payer: Medicare Other | Admitting: Physical Therapy

## 2017-11-12 ENCOUNTER — Ambulatory Visit (INDEPENDENT_AMBULATORY_CARE_PROVIDER_SITE_OTHER): Payer: Medicare Other | Admitting: Orthopedic Surgery

## 2017-11-12 ENCOUNTER — Encounter (INDEPENDENT_AMBULATORY_CARE_PROVIDER_SITE_OTHER): Payer: Self-pay | Admitting: Orthopedic Surgery

## 2017-11-12 DIAGNOSIS — M1711 Unilateral primary osteoarthritis, right knee: Secondary | ICD-10-CM

## 2017-11-12 DIAGNOSIS — Z96651 Presence of right artificial knee joint: Secondary | ICD-10-CM

## 2017-11-12 NOTE — Progress Notes (Signed)
   Post-Op Visit Note   Patient: Isaac [name]           Date of Birth: 12-25-1945           MRN: [id number] Visit Date: 11/12/2017 PCP: Nicola Girt, DO   Assessment & Plan:  Chief Complaint:  Chief Complaint  Patient presents with  . Right Knee - Pain, Follow-up, Routine Post Op   Visit Diagnoses:  1. Unilateral primary osteoarthritis, right knee   2. Status post unicompartmental knee replacement, right     Plan: Isaac [name] is a patient is now 50 days out right knee unicompartmental knee replacement.  She is been doing well.  Is been doing strengthening and range of motion exercises on his own.  He is been released with physical therapy.  On examination he has essentially full range of motion trace effusion.  Incision looks good.  Plan is to continue with activities as tolerated.  Do not want him doing a lot of heavy lifting more than 25 pounds until his quad strength improves to normal which may take until the end of summer.  Currently he is asymptomatic and pleased with his surgical result.  I will see him back as needed  Follow-Up Instructions: Return if symptoms worsen or fail to improve.   Orders:  No orders of the defined types were placed in this encounter.  No orders of the defined types were placed in this encounter.   Imaging: No results found.  PMFS History: Patient Active Problem List   Diagnosis Date Noted  . Mild intermittent asthma without complication 03/88/8280  . Seasonal allergic rhinitis due to pollen 10/30/2017  . Arthritis of knee 09/24/2017  . Allergy with anaphylaxis due to food, subsequent encounter 02/16/2015  . Allergic rhinitis 03/20/2011  . Mild persistent asthma 03/20/2011   Past Medical History:  Diagnosis Date  . Anxiety   . Asthma   . Cancer ([place])    SKIN  . Coronary artery disease   . GERD (gastroesophageal reflux disease)   . Headache   . High triglycerides   . Myocardial infarction ([place])   . Pneumonia    2010    History  reviewed. No pertinent family history.  Past Surgical History:  Procedure Laterality Date  . APPENDECTOMY    . CHOLECYSTECTOMY    . COLON SURGERY    . CORONARY ANGIOPLASTY WITH STENT PLACEMENT    . PARTIAL KNEE ARTHROPLASTY Right 09/24/2017   Procedure: RIGHT UNICOMPARTMENTAL KNEE;  Surgeon: Meredith Pel, MD;  Location: [facility];  Service: Orthopedics;  Laterality: Right;  . TONSILLECTOMY     Social History   Occupational History  . Not on file  Tobacco Use  . Smoking status: Never Smoker  . Smokeless tobacco: Never Used  Substance and Sexual Activity  . Alcohol use: Yes    Comment: monthly  . Drug use: No  . Sexual activity: Not on file

## 2017-11-28 ENCOUNTER — Telehealth: Payer: Self-pay

## 2017-11-28 NOTE — Telephone Encounter (Signed)
Patient called back and informed me that he is doing better. Patient stated he took benadryl and prednisone and his hives have subsided. I informed patient that if he gets hives again to keep a journal and log any foods or beverages he consumed as well as any medications taken 6 hours prior to the on set of hives. Patient understood.

## 2017-11-28 NOTE — Telephone Encounter (Signed)
Dr.Gallagher received a message from Dr. Doug Sou about patient having hives, he wanted me to reach out to patient to see how his hives are and if he needs to come in sooner than 1 year. I called and left a message for him to call office in regards to this matter.

## 2017-11-28 NOTE — Telephone Encounter (Signed)
Spoke with Caryl Pina and agree with the plan.   Salvatore Marvel, MD [facility]

## 2018-03-24 IMAGING — MR MR LUMBAR SPINE W/O CM
5 series · 47 of 48 positions shown · non-contrast
Comparison: Lumbar spine radiographs 04/17/2017

CLINICAL DATA: Low back pain radiating into both legs.

EXAM:
MRI LUMBAR SPINE WITHOUT CONTRAST
TECHNIQUE: Multiplanar, multisequence MR imaging of the lumbar spine was
performed. No intravenous contrast was administered.

[Series 2: T2 · sagittal · 4.0mm · 0.81mm/px · 6 of 16 slices shown (1 of 2)]
[im 1/16]
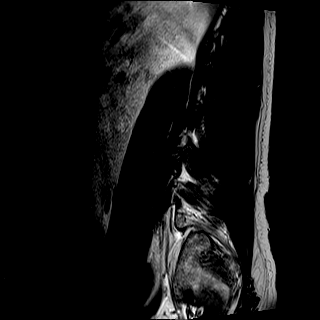
[im 4/16]
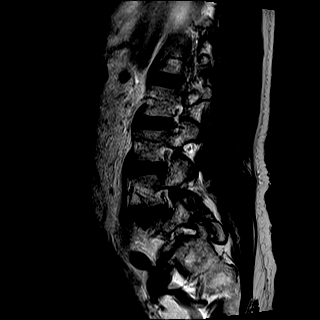
[im 7/16]
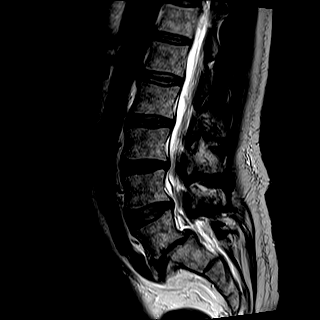
[im 10/16]
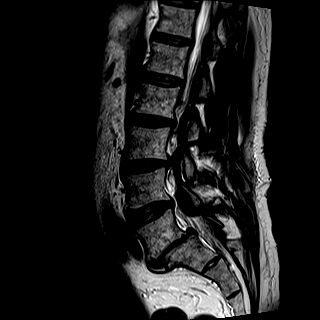
[im 13/16]
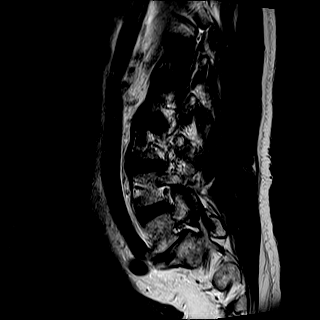
[im 16/16]
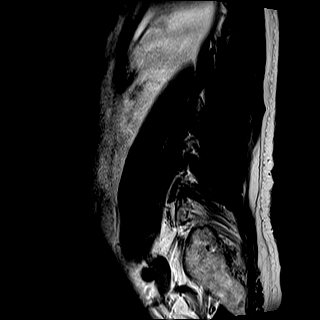

[Series 3: STIR · sagittal · 4.0mm · 0.81mm/px · 7 of 16 slices shown]
[im 1/16]
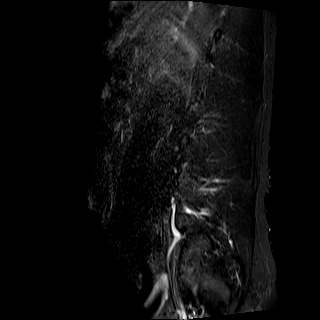
[im 3/16]
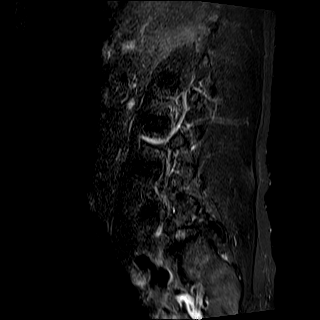
[im 6/16]
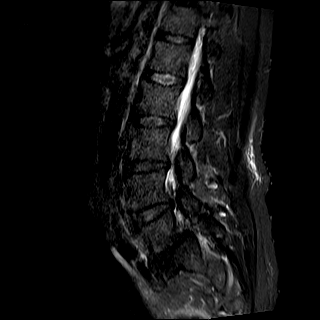
[im 8/16]
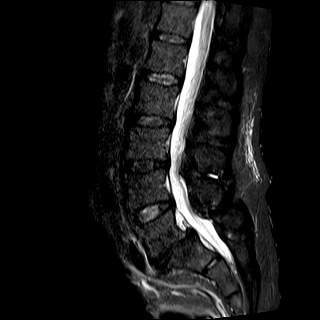
[im 11/16]
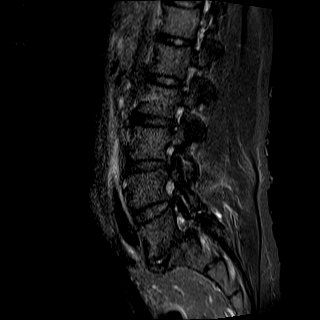
[im 13/16]
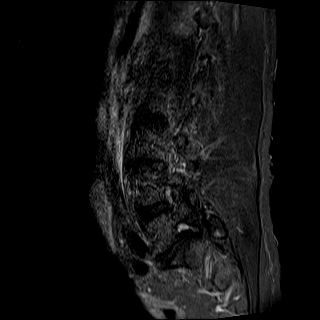
[im 16/16]
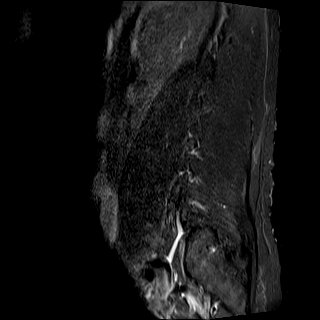

[Series 4: T1 · sagittal · 4.0mm · 0.81mm/px · 7 of 16 slices shown (1 of 2)]
[im 1/16]
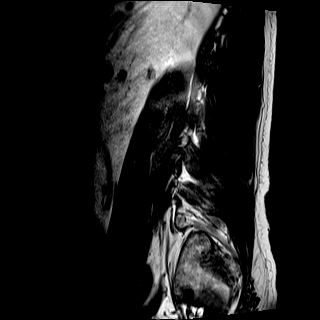
[im 3/16]
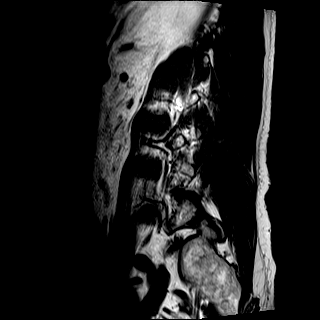
[im 6/16]
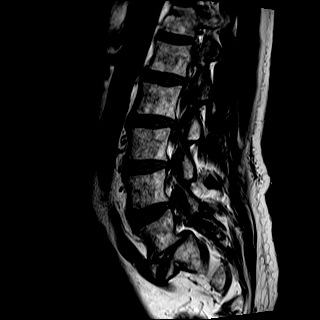
[im 8/16]
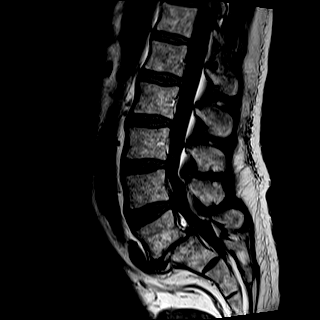
[im 11/16]
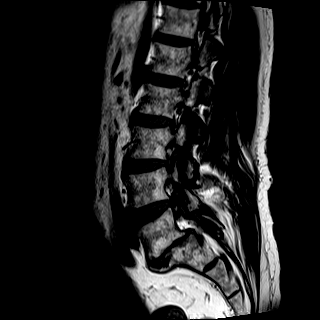
[im 13/16]
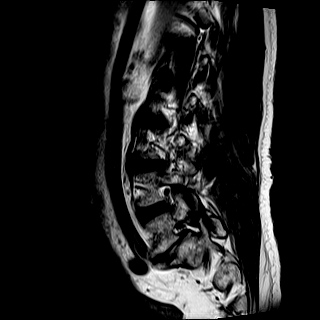
[im 16/16]
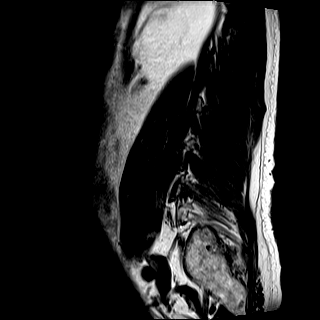

[Series 5: T2 · axial · 4.0mm · 0.62mm/px · z∈[-52,+154]mm · 14 of 32 slices shown (2 of 2)]
[im 1/32]
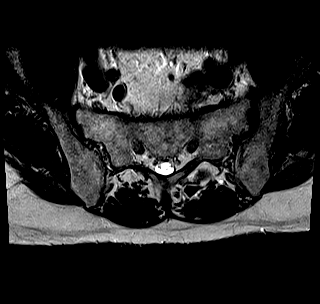
[im 3/32]
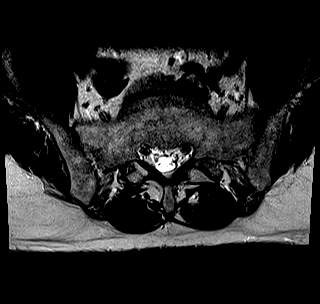
[im 5/32]
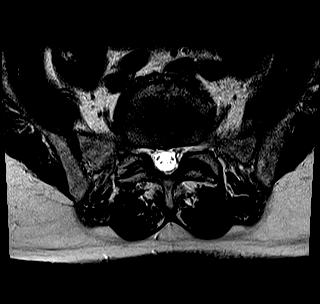
[im 8/32]
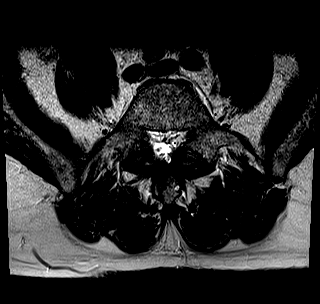
[im 10/32]
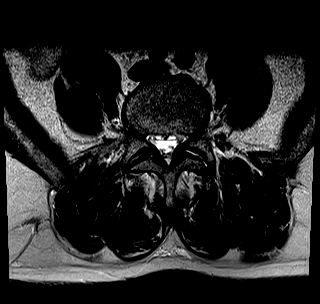
[im 12/32]
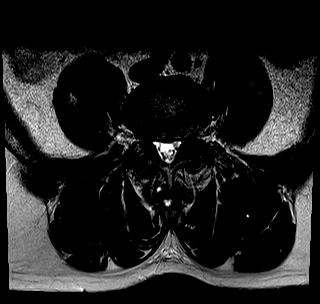
[im 15/32]
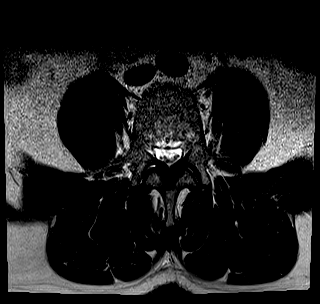
[im 17/32]
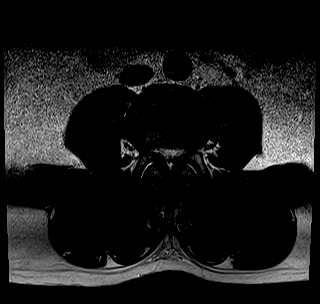
[im 20/32]
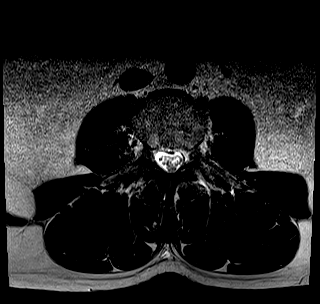
[im 22/32]
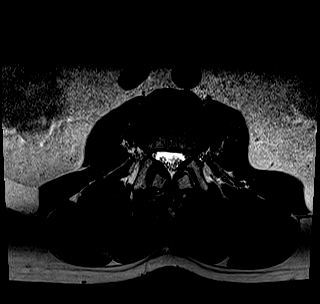
[im 24/32]
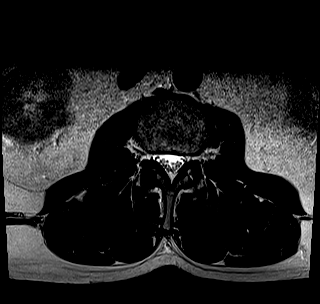
[im 27/32]
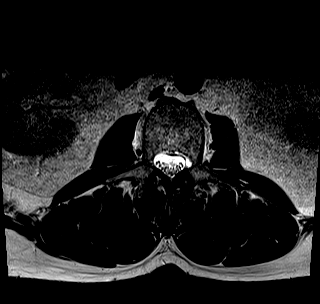
[im 29/32]
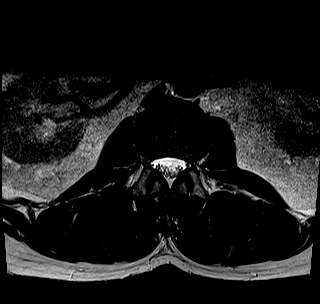
[im 32/32]
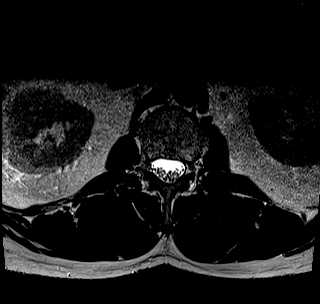

[Series 6: T1 · axial · 4.0mm · 0.62mm/px · z∈[-45,+152]mm · 13 of 32 slices shown (2 of 2)]
[im 1/32]
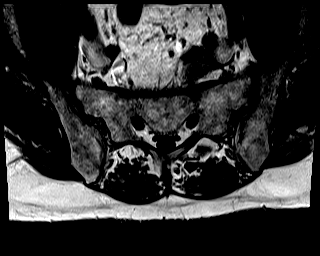
[im 3/32]
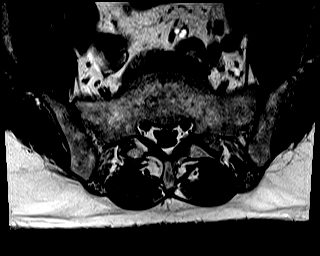
[im 5/32]
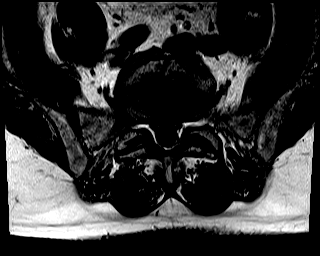
[im 8/32]
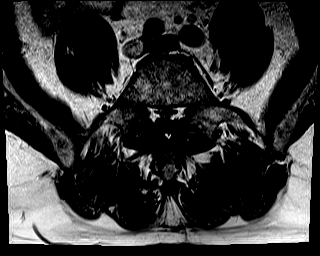
[im 10/32]
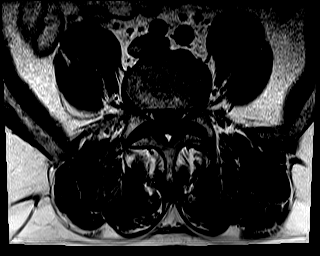
[im 12/32]
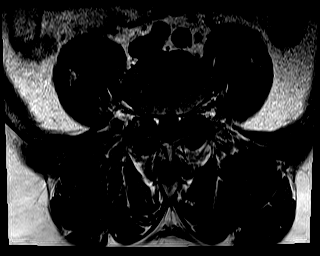
[im 15/32]
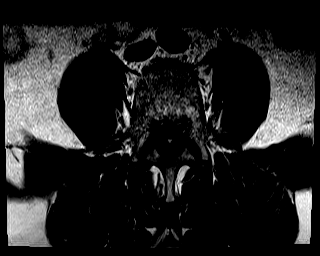
[im 17/32]
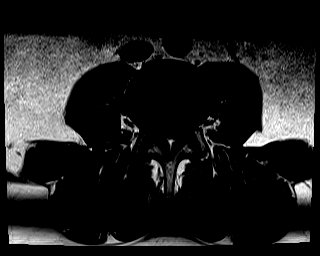
[im 20/32]
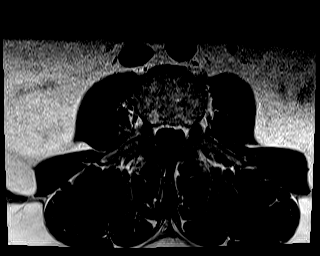
[im 22/32]
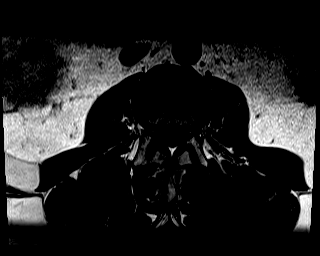
[im 24/32]
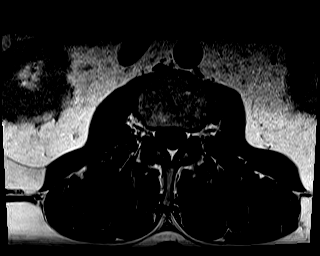
[im 27/32]
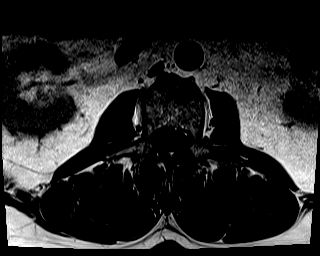
[im 32/32]
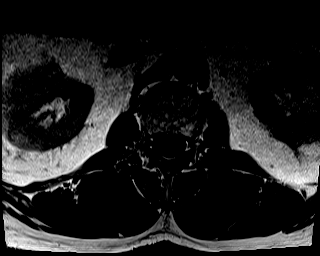

[47 of 48 positions shown; findings below may reference images not displayed]

FINDINGS: Segmentation:  Standard.

Alignment: Bilateral L5 pars defects with 7 mm anterolisthesis of L5
on S1. Minimal left convex lumbar spine curvature.

Vertebrae: Preserved vertebral body heights without evidence of
acute fracture or osseous lesion.

Conus medullaris and cauda equina: Conus extends to the T12-L1
level. Conus and cauda equina appear normal.

Paraspinal and other soft tissues: Unremarkable.

Disc levels:

L1-2: Negative.

L2-3: Mild disc desiccation. Mild disc bulging and mild facet
hypertrophy result in mild bilateral lateral recess stenosis without
significant spinal or neural foraminal stenosis.

L3-4: Disc desiccation and slight disc space narrowing.
Circumferential disc bulging, congenitally short pedicles, and
moderate facet and ligamentum flavum hypertrophy result in moderate
spinal stenosis, moderate bilateral lateral recess stenosis, and
mild bilateral neural foraminal stenosis. The L4 nerve roots could
be affected in the lateral recesses. Interspinous bursa formation.

L4-5: Mild disc bulging, congenitally short pedicles, and moderate
facet and ligamentum flavum hypertrophy result in mild left greater
than right lateral recess stenosis and mild left greater than right
neural foraminal stenosis. No significant spinal stenosis pre

L5-S1: Complete disc space height loss. Anterolisthesis with disc
uncovering and disc space narrowing result in mild bilateral neural
foraminal stenosis without spinal stenosis.
IMPRESSION: 1. Multilevel lumbar disc and facet degeneration, most notable at
L4-5 where there is moderate multifactorial spinal and lateral
recess stenosis.
2. L5 pars defects with grade 1 anterolisthesis and mild neural
foraminal stenosis.
3. Mild lateral recess stenosis at L2-3 and L4-5.

## 2018-04-07 ENCOUNTER — Other Ambulatory Visit: Payer: Self-pay | Admitting: Allergy

## 2018-04-08 ENCOUNTER — Telehealth: Payer: Self-pay

## 2018-04-08 MED ORDER — IPRATROPIUM BROMIDE 0.06 % NA SOLN: 2.0000 | Freq: Three times a day (TID) | NASAL | 1 refills | Status: DC

## 2018-04-08 NOTE — Telephone Encounter (Signed)
Received fax for 90 supply for Atrovent. Patient was seen 5/20419 and is to return 10/2018, Rx sent in.

## 2018-06-24 HISTORY — PX: TOE SURGERY: SHX1073

## 2018-07-03 ENCOUNTER — Telehealth (INDEPENDENT_AMBULATORY_CARE_PROVIDER_SITE_OTHER): Payer: Self-pay | Admitting: Physical Medicine and Rehabilitation

## 2018-07-03 NOTE — Telephone Encounter (Signed)
If mostly back pain they would look at L4-5 and L5-S1 facet joint blocks.  He has L5 pars defects with listhesis.  He has multifactorial moderate stenosis at L3-4.  If his symptoms are very similar to the prior injection and that did not help and I think we have to look at the L5 area.

## 2018-07-03 NOTE — Telephone Encounter (Signed)
Called pt and lvm #1 on mobile.

## 2018-07-07 NOTE — Telephone Encounter (Signed)
Scheduled for 07/16/18 with driver.

## 2018-07-07 NOTE — Telephone Encounter (Signed)
Returned pt called pt lvm #2

## 2018-07-07 NOTE — Telephone Encounter (Signed)
Patient called back. Can you return his call?

## 2018-07-16 ENCOUNTER — Encounter (INDEPENDENT_AMBULATORY_CARE_PROVIDER_SITE_OTHER): Payer: Medicare Other | Admitting: Physical Medicine and Rehabilitation

## 2018-07-21 ENCOUNTER — Encounter (INDEPENDENT_AMBULATORY_CARE_PROVIDER_SITE_OTHER): Payer: Self-pay | Admitting: Physical Medicine and Rehabilitation

## 2018-07-21 ENCOUNTER — Ambulatory Visit (INDEPENDENT_AMBULATORY_CARE_PROVIDER_SITE_OTHER): Payer: Medicare Other | Admitting: Physical Medicine and Rehabilitation

## 2018-07-21 ENCOUNTER — Ambulatory Visit (INDEPENDENT_AMBULATORY_CARE_PROVIDER_SITE_OTHER): Payer: Self-pay

## 2018-07-21 VITALS — BP 142/67 | HR 60 | Temp 97.6°F

## 2018-07-21 DIAGNOSIS — M5416 Radiculopathy, lumbar region: Secondary | ICD-10-CM | POA: Diagnosis not present

## 2018-07-21 MED ORDER — BETAMETHASONE SOD PHOS & ACET 6 (3-3) MG/ML IJ SUSP
12.0000 mg | Freq: Once | INTRAMUSCULAR | Status: AC
  Administered 2018-07-21: 12 mg

## 2018-07-21 NOTE — Patient Instructions (Signed)

## 2018-07-21 NOTE — Progress Notes (Signed)
 .  Numeric Pain Rating Scale and Functional Assessment Average Pain 5   In the last MONTH (on 0-10 scale) has pain interfered with the following?  1. General activity like being  able to carry out your everyday physical activities such as walking, climbing stairs, carrying groceries, or moving a chair?  Rating(4)   +Driver, -BT, -Dye Allergies.  

## 2018-08-05 ENCOUNTER — Ambulatory Visit (INDEPENDENT_AMBULATORY_CARE_PROVIDER_SITE_OTHER): Payer: Medicare Other

## 2018-08-05 ENCOUNTER — Encounter (INDEPENDENT_AMBULATORY_CARE_PROVIDER_SITE_OTHER): Payer: Self-pay | Admitting: Orthopedic Surgery

## 2018-08-05 ENCOUNTER — Ambulatory Visit (INDEPENDENT_AMBULATORY_CARE_PROVIDER_SITE_OTHER): Payer: Medicare Other | Admitting: Orthopedic Surgery

## 2018-08-05 DIAGNOSIS — M5442 Lumbago with sciatica, left side: Secondary | ICD-10-CM

## 2018-08-05 DIAGNOSIS — M5441 Lumbago with sciatica, right side: Secondary | ICD-10-CM

## 2018-08-05 DIAGNOSIS — M25561 Pain in right knee: Secondary | ICD-10-CM

## 2018-08-05 DIAGNOSIS — G8929 Other chronic pain: Secondary | ICD-10-CM

## 2018-08-05 DIAGNOSIS — Z96651 Presence of right artificial knee joint: Secondary | ICD-10-CM

## 2018-08-07 ENCOUNTER — Telehealth (INDEPENDENT_AMBULATORY_CARE_PROVIDER_SITE_OTHER): Payer: Self-pay | Admitting: *Deleted

## 2018-08-08 NOTE — Progress Notes (Signed)
Office Visit Note   Patient: Isaac [name]           Date of Birth: [birth year]           MRN: [id number] Visit Date: 08/05/2018 Requested by: Nicola Girt, [address] [place],  [address] PCP: Nicola Girt, DO  Subjective: Chief Complaint  Patient presents with  . Right Knee - Pain  . Lower Back - Pain    HPI: Isaac [name] is a patient with low back pain and mild right knee pain.  Had unicompartmental knee replacement done 2019.  He is not having any knee pain but he states he is limping.  Had L-spine injection in January of this year.  Reports pain across the buttocks radiating into the right leg more than the left leg.  He was doing 2 to 3 miles of walking a day but now cannot do that.  He does have a history of physical therapy for his back.              ROS: All systems reviewed are negative as they relate to the chief complaint within the history of present illness.  Patient denies  fevers or chills.   Assessment & Plan: Visit Diagnoses:  1. Status post unicompartmental knee replacement, right   2. Chronic pain of right knee   3. Chronic low back pain with bilateral sciatica, unspecified back pain laterality     Plan: Impression is low back pain with well-functioning and well-appearing right unicompartmental knee replacement.  I think would be good for him to consider referral back to Dr. Ernestina Patches for another injection into his lumbar spine.  I think this could be either epidural steroid injection versus facet injection depending on which 1 would potentially give him the best relief.  Revisiting physical therapy exercises for hamstring stretching and core strengthening also indicated.  I do not think there is a problem with the knee at this time.  I will see him back after he decides about further back treatment.  Follow-Up Instructions: No follow-ups on file.   Orders:  Orders Placed This Encounter  Procedures  . XR KNEE 3 VIEW RIGHT  . XR Lumbar  Spine 2-3 Views   No orders of the defined types were placed in this encounter.     Procedures: No procedures performed   Clinical Data: No additional findings.  Objective: Vital Signs: There were no vitals taken for this visit.  Physical Exam:   Constitutional: Patient appears well-developed HEENT:  Head: Normocephalic Eyes:EOM are normal Neck: Normal range of motion Cardiovascular: Normal rate Pulmonary/chest: Effort normal Neurologic: Patient is alert Skin: Skin is warm Psychiatric: Patient has normal mood and affect    Ortho Exam: Ortho exam demonstrates pretty normal gait alignment today.  No nerve root tension signs right or left hand side.  Knee range of motion is full.  Extensor mechanism is intact.  Collateral crucial ligaments are stable.  Range of motion the right knee is excellent with no lateral joint line tenderness and no effusion.  No paresthesias L1 S1 bilaterally.  Specialty Comments:  No specialty comments available.  Imaging: No results found.   PMFS History: Patient Active Problem List   Diagnosis Date Noted  . Mild intermittent asthma without complication 74/16/3845  . Seasonal allergic rhinitis due to pollen 10/30/2017  . Arthritis of knee 09/24/2017  . Allergy with anaphylaxis due to food, subsequent encounter 02/16/2015  . Allergic rhinitis 03/20/2011  . Mild persistent  asthma 03/20/2011   Past Medical History:  Diagnosis Date  . Anxiety   . Asthma   . Cancer ([place])    SKIN  . Coronary artery disease   . GERD (gastroesophageal reflux disease)   . Headache   . High triglycerides   . Myocardial infarction ([place])   . Pneumonia    2010    History reviewed. No pertinent family history.  Past Surgical History:  Procedure Laterality Date  . APPENDECTOMY    . CHOLECYSTECTOMY    . COLON SURGERY    . CORONARY ANGIOPLASTY WITH STENT PLACEMENT    . PARTIAL KNEE ARTHROPLASTY Right 09/24/2017   Procedure: RIGHT UNICOMPARTMENTAL KNEE;   Surgeon: Meredith Pel, MD;  Location: [facility];  Service: Orthopedics;  Laterality: Right;  . TONSILLECTOMY     Social History   Occupational History  . Not on file  Tobacco Use  . Smoking status: Never Smoker  . Smokeless tobacco: Never Used  Substance and Sexual Activity  . Alcohol use: Yes    Comment: monthly  . Drug use: No  . Sexual activity: Not on file

## 2018-08-11 NOTE — Telephone Encounter (Signed)
Last injection was L3 TF would try L4 TF bilateral

## 2018-08-11 NOTE — Telephone Encounter (Signed)
Pt is scheduled for 08/26/2018 with driver.

## 2018-08-26 ENCOUNTER — Encounter (INDEPENDENT_AMBULATORY_CARE_PROVIDER_SITE_OTHER): Payer: Self-pay | Admitting: Physical Medicine and Rehabilitation

## 2018-08-26 ENCOUNTER — Ambulatory Visit (INDEPENDENT_AMBULATORY_CARE_PROVIDER_SITE_OTHER): Payer: Medicare Other | Admitting: Physical Medicine and Rehabilitation

## 2018-08-26 ENCOUNTER — Ambulatory Visit (INDEPENDENT_AMBULATORY_CARE_PROVIDER_SITE_OTHER): Payer: Self-pay

## 2018-08-26 VITALS — BP 150/75 | HR 59 | Temp 97.5°F

## 2018-08-26 DIAGNOSIS — M47816 Spondylosis without myelopathy or radiculopathy, lumbar region: Secondary | ICD-10-CM | POA: Diagnosis not present

## 2018-08-26 DIAGNOSIS — M48062 Spinal stenosis, lumbar region with neurogenic claudication: Secondary | ICD-10-CM

## 2018-08-26 MED ORDER — BETAMETHASONE SOD PHOS & ACET 6 (3-3) MG/ML IJ SUSP
12.0000 mg | Freq: Once | INTRAMUSCULAR | Status: AC
  Administered 2018-08-26: 12 mg

## 2018-08-26 NOTE — Progress Notes (Signed)
 .  Numeric Pain Rating Scale and Functional Assessment Average Pain 5   In the last MONTH (on 0-10 scale) has pain interfered with the following?  1. General activity like being  able to carry out your everyday physical activities such as walking, climbing stairs, carrying groceries, or moving a chair?  Rating(3)   +Driver, -BT, -Dye Allergies.  

## 2018-09-09 ENCOUNTER — Telehealth (INDEPENDENT_AMBULATORY_CARE_PROVIDER_SITE_OTHER): Payer: Self-pay | Admitting: Physical Medicine and Rehabilitation

## 2018-09-10 NOTE — Telephone Encounter (Signed)
I think next steps/options include:  spine surgery consult for questions, PT/Chiro regroup, try gabapentin taper up and f/up me in 4 to 5 weeks

## 2018-09-11 NOTE — Telephone Encounter (Signed)
Patient will consider these options and let us know what he wants to do.

## 2018-09-15 NOTE — Procedures (Signed)
Lumbar Facet Joint Intra-Articular Injection(s) with Fluoroscopic Guidance  Patient: Isaac [name]      Date of Birth: 16-Jun-1946 MRN: [id number] PCP: Nicola Girt, DO      Visit Date: 08/26/2018   Universal Protocol:    Date/Time: 08/26/2018  Consent Given By: the patient  Position: PRONE   Additional Comments: Vital signs were monitored before and after the procedure. Patient was prepped and draped in the usual sterile fashion. The correct patient, procedure, and site was verified.   Injection Procedure Details:  Procedure Site One Meds Administered:  Meds ordered this encounter  Medications  . betamethasone acetate-betamethasone sodium phosphate (CELESTONE) injection 12 mg     Laterality: Bilateral  Location/Site:  L5-S1  Needle size: 22 guage  Needle type: Spinal  Needle Placement: Articular  Findings:  -Comments: Excellent flow of contrast producing a partial arthrogram.  Procedure Details: The fluoroscope beam is vertically oriented in AP, and the inferior recess is visualized beneath the lower pole of the inferior apophyseal process, which represents the target point for needle insertion. When direct visualization is difficult the target point is located at the medial projection of the vertebral pedicle. The region overlying each aforementioned target is locally anesthetized with a 1 to 2 ml. volume of 1% Lidocaine without Epinephrine.   The spinal needle was inserted into each of the above mentioned facet joints using biplanar fluoroscopic guidance. A 0.25 to 0.5 ml. volume of Isovue-250 was injected and a partial facet joint arthrogram was obtained. A single spot film was obtained of the resulting arthrogram.    One to 1.25 ml of the steroid/anesthetic solution was then injected into each of the facet joints noted above.   Additional Comments:  The patient tolerated the procedure well Dressing: 2 x 2 sterile gauze and Band-Aid    Post-procedure  details: Patient was observed during the procedure. Post-procedure instructions were reviewed.  Patient left the clinic in stable condition.

## 2018-09-15 NOTE — Progress Notes (Signed)
Isaac [name] - 73 y.o. male MRN [id number]  Date of birth: 04/11/1946  Office Visit Note: Visit Date: 07/21/2018 PCP: Nicola Girt, DO Referred by: Nicola Girt, DO  Subjective: Chief Complaint  Patient presents with  . Lower Back - Pain  . Right Lower Leg - Pain   HPI: Isaac [name] is a 73 y.o. male who comes in today For reevaluation management of low back pain and bilateral mostly right-sided leg pain.  He reports pain began to come back around October 2019.  He is somewhat of a hard historian though the prior injection was in February 2019 when you ask him about the injection he had felt like it helped that much although it did seem to help at first.  Since we have seen him he is also had his right partial knee arthroplasty by Dr. Marlou Sa.  I think given the fact that it did seem to help we will go ahead and repeat the bilateral L3 transforaminal injection.  He does have moderate multifactorial stenosis at this level.  He also has L5-S1 facet arthritis and pars defect with small listhesis and degenerative disc disease with foraminal narrowing.  This could be the level of symptomatic issues as well.  ROS Otherwise per HPI.  Assessment & Plan: Visit Diagnoses:  1. Lumbar radiculopathy     Plan: No additional findings.   Meds & Orders:  Meds ordered this encounter  Medications  . betamethasone acetate-betamethasone sodium phosphate (CELESTONE) injection 12 mg    Orders Placed This Encounter  Procedures  . XR C-ARM NO REPORT  . Epidural Steroid injection    Follow-up: Return if symptoms worsen or fail to improve.   Procedures: No procedures performed  Lumbosacral Transforaminal Epidural Steroid Injection - Sub-Pedicular Approach with Fluoroscopic Guidance  Patient: Isaac [name]      Date of Birth: Nov 30, 1945 MRN: [id number] PCP: Nicola Girt, DO      Visit Date: 07/21/2018   Universal Protocol:    Date/Time: 07/21/2018  Consent Given By: the patient  Position:  PRONE  Additional Comments: Vital signs were monitored before and after the procedure. Patient was prepped and draped in the usual sterile fashion. The correct patient, procedure, and site was verified.   Injection Procedure Details:  Procedure Site One Meds Administered:  Meds ordered this encounter  Medications  . betamethasone acetate-betamethasone sodium phosphate (CELESTONE) injection 12 mg    Laterality: Bilateral  Location/Site:  L3-L4  Needle size: 22 G  Needle type: Spinal  Needle Placement: Transforaminal  Findings:    -Comments: Excellent flow of contrast along the nerve and into the epidural space.  Procedure Details: After squaring off the end-plates to get a true AP view, the C-arm was positioned so that an oblique view of the foramen as noted above was visualized. The target area is just inferior to the "nose of the scotty dog" or sub pedicular. The soft tissues overlying this structure were infiltrated with 2-3 ml. of 1% Lidocaine without Epinephrine.  The spinal needle was inserted toward the target using a "trajectory" view along the fluoroscope beam.  Under AP and lateral visualization, the needle was advanced so it did not puncture dura and was located close the 6 O'Clock position of the pedical in AP tracterory. Biplanar projections were used to confirm position. Aspiration was confirmed to be negative for CSF and/or blood. A 1-2 ml. volume of Isovue-250 was injected and flow of contrast was noted at each level. Radiographs were obtained for  documentation purposes.   After attaining the desired flow of contrast documented above, a 0.5 to 1.0 ml test dose of 0.25% Marcaine was injected into each respective transforaminal space.  The patient was observed for 90 seconds post injection.  After no sensory deficits were reported, and normal lower extremity motor function was noted,   the above injectate was administered so that equal amounts of the injectate were  placed at each foramen (level) into the transforaminal epidural space.   Additional Comments:  The patient tolerated the procedure well Dressing: 2 x 2 sterile gauze and Band-Aid    Post-procedure details: Patient was observed during the procedure. Post-procedure instructions were reviewed.  Patient left the clinic in stable condition.    Clinical History: MRI LUMBAR SPINE WITHOUT CONTRAST  TECHNIQUE: Multiplanar, multisequence MR imaging of the lumbar spine was performed. No intravenous contrast was administered.  COMPARISON:  Lumbar spine radiographs 04/17/2017  FINDINGS: Segmentation:  Standard.  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 Minimal left convex lumbar spine curvature.  Vertebrae: Preserved vertebral body heights without evidence of acute fracture or osseous lesion.  Conus medullaris and cauda equina: Conus extends to the T12-L1 level. Conus and cauda equina appear normal.  Paraspinal and other soft tissues: Unremarkable.  Disc levels:  L1-2: Negative.  L2-3: Mild disc desiccation. Mild disc bulging and mild facet hypertrophy result in mild bilateral lateral recess stenosis without significant spinal or neural foraminal stenosis.  L3-4: Disc desiccation and slight disc space narrowing. Circumferential disc bulging, congenitally short pedicles, and moderate facet and ligamentum flavum hypertrophy result in moderate spinal stenosis, moderate bilateral lateral recess stenosis, and mild bilateral neural foraminal stenosis. The L4 nerve roots could be affected in the lateral recesses. Interspinous bursa formation.  L4-5: Mild disc bulging, congenitally short pedicles, and moderate facet and ligamentum flavum hypertrophy result in mild left greater than right lateral recess stenosis and mild left greater than right neural foraminal stenosis. No significant spinal stenosis pre  L5-S1: Complete disc space  height loss. Anterolisthesis with disc uncovering and disc space narrowing result in mild bilateral neural foraminal stenosis without spinal stenosis.  IMPRESSION: 1. Multilevel lumbar disc and facet degeneration, most notable at L4-5 where there is moderate multifactorial spinal and lateral recess stenosis. 2. 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 3. Mild lateral recess stenosis at L2-3 and L4-5.   Electronically Signed   By: Logan Bores M.D.   On: 07/26/2017 16:14   He reports that he has never smoked. He has never used smokeless tobacco. No results for input(s): HGBA1C, LABURIC in the last 8760 hours.  Objective:  VS:  HT:    WT:   BMI:     BP:(!) 142/67  HR:60bpm  TEMP:97.6 F (36.4 C)(Oral)  RESP:  Physical Exam  Ortho Exam Imaging: No results found.  Past Medical/Family/Surgical/Social History: Medications & Allergies reviewed per EMR, new medications updated. Patient Active Problem List   Diagnosis Date Noted  . Mild intermittent asthma without complication 09/38/1829  . Seasonal allergic rhinitis due to pollen 10/30/2017  . Arthritis of knee 09/24/2017  . Allergy with anaphylaxis due to food, subsequent encounter 02/16/2015  . Allergic rhinitis 03/20/2011  . Mild persistent asthma 03/20/2011   Past Medical History:  Diagnosis Date  . Anxiety   . Asthma   . Cancer ([place])    SKIN  . Coronary artery disease   . GERD (gastroesophageal reflux disease)   .  Headache   . High triglycerides   . Myocardial infarction ([place])   . Pneumonia    2010   History reviewed. No pertinent family history. Past Surgical History:  Procedure Laterality Date  . APPENDECTOMY    . CHOLECYSTECTOMY    . COLON SURGERY    . CORONARY ANGIOPLASTY WITH STENT PLACEMENT    . PARTIAL KNEE ARTHROPLASTY Right 09/24/2017   Procedure: RIGHT UNICOMPARTMENTAL KNEE;  Surgeon: Meredith Pel, MD;  Location: [facility];  Service: Orthopedics;   Laterality: Right;  . TONSILLECTOMY     Social History   Occupational History  . Not on file  Tobacco Use  . Smoking status: Never Smoker  . Smokeless tobacco: Never Used  Substance and Sexual Activity  . Alcohol use: Yes    Comment: monthly  . Drug use: No  . Sexual activity: Not on file

## 2018-09-15 NOTE — Progress Notes (Signed)
Isaac [name] - 73 y.o. male MRN [id number]  Date of birth: 09/16/1945  Office Visit Note: Visit Date: 08/26/2018 PCP: Nicola Girt, DO Referred by: Nicola Girt, DO  Subjective: Chief Complaint  Patient presents with   Lower Back - Pain   Right Leg - Pain   HPI: Isaac [name] is a 73 y.o. male who comes in today For reevaluation and management of his low back and referral buttock pain.  This is been a chronic ongoing situation for him.  He has had this over the last several years with worsening.  We initially saw the patient at the request of G. Alphonzo Severance.  He has had a partial knee arthroplasty by Dr. Marlou Sa last year.  Prior to that we completed bilateral L3 transforaminal epidural steroid injection that at least for 2 days gave him really profound relief and then the symptoms came right back.  This is interesting because that L3-4 he really only has moderate narrowing there is no high-grade stenosis or focal nerve compression.  This does seem to be diagnostic though as it did help.  Unfortunately in January of this year we repeated that injection and again he says it did not help only numb the pain for 2 days.  In essence it did help but it was just for the first couple of days.  Interestingly the medication should work that way it should help maybe for the first few hours and then if the cortisone helps it may take a few days to actually get started but occasionally we do see some pain relief for a few days.  Diagnostically this is very hard to figure out at this point.  He does have significant facet arthropathy at L5-S1 with pars defects and listhesis.  He has degenerative disc height loss and foraminal narrowing as a result of the slippage.  I do think this is probably the source of pain at this point in the tricky situation is still whether this is foraminal narrowing and nerve pain or more facet pain.  We had a long discussion with him about this today.  Most of his pain is with  standing and going from sit to stand.  Less pain with sitting.  Consistent with a neurogenic claudication.  He does get some relief with medication and sitting and laying down.  It does limit what he can do daily.  He said no focal weakness.  Review of Systems  Constitutional: Negative for chills, fever, malaise/fatigue and weight loss.  HENT: Negative for hearing loss and sinus pain.   Eyes: Negative for blurred vision, double vision and photophobia.  Respiratory: Negative for cough and shortness of breath.   Cardiovascular: Negative for chest pain, palpitations and leg swelling.  Gastrointestinal: Negative for abdominal pain, nausea and vomiting.  Genitourinary: Negative for flank pain.  Musculoskeletal: Positive for back pain and joint pain. Negative for myalgias.  Skin: Negative for itching and rash.  Neurological: Negative for tremors, focal weakness and weakness.  Endo/Heme/Allergies: Negative.   Psychiatric/Behavioral: Negative for depression.  All other systems reviewed and are negative.  Otherwise per HPI.  Assessment & Plan: Visit Diagnoses:  1. Spondylosis without myelopathy or radiculopathy, lumbar region   2. Spinal stenosis of lumbar region with neurogenic claudication     Plan: Findings:  Diagnostically very difficult low back and buttock pain which is seemingly consistent with a lumbar stenosis but also consistent with facet arthropathy at L5-S1.  Prior injections at L3 showed very good flow  of contrast can very well placed but he has no significant relief for any length of time and I think that is somewhat of a red herring.  We will get a try facet joint blocks at L5-S1 today.  If this not beneficial I would look at bilateral L5 transforaminal injection versus S1 transforaminal injection.  I think we should be able to get him some relief at least diagnostically to see where this is headed.  If it were to be a surgical situation at least it would be nice to know which level  was bothering him.  No change in medication at this point.  We talked that activity modification etc.  We did go ahead and complete diagnostic facet joint blocks today.  Could look at radiofrequency ablation depending on relief.    Meds & Orders:  Meds ordered this encounter  Medications   betamethasone acetate-betamethasone sodium phosphate (CELESTONE) injection 12 mg    Orders Placed This Encounter  Procedures   Facet Injection   XR C-ARM NO REPORT    Follow-up: Return if symptoms worsen or fail to improve.   Procedures: No procedures performed  Lumbar Facet Joint Intra-Articular Injection(s) with Fluoroscopic Guidance  Patient: Isaac [name]      Date of Birth: Jun 04, 1946 MRN: [id number] PCP: Nicola Girt, DO      Visit Date: 08/26/2018   Universal Protocol:    Date/Time: 08/26/2018  Consent Given By: the patient  Position: PRONE   Additional Comments: Vital signs were monitored before and after the procedure. Patient was prepped and draped in the usual sterile fashion. The correct patient, procedure, and site was verified.   Injection Procedure Details:  Procedure Site One Meds Administered:  Meds ordered this encounter  Medications   betamethasone acetate-betamethasone sodium phosphate (CELESTONE) injection 12 mg     Laterality: Bilateral  Location/Site:  L5-S1  Needle size: 22 guage  Needle type: Spinal  Needle Placement: Articular  Findings:  -Comments: Excellent flow of contrast producing a partial arthrogram.  Procedure Details: The fluoroscope beam is vertically oriented in AP, and the inferior recess is visualized beneath the lower pole of the inferior apophyseal process, which represents the target point for needle insertion. When direct visualization is difficult the target point is located at the medial projection of the vertebral pedicle. The region overlying each aforementioned target is locally anesthetized with a 1 to 2 ml. volume of  1% Lidocaine without Epinephrine.   The spinal needle was inserted into each of the above mentioned facet joints using biplanar fluoroscopic guidance. A 0.25 to 0.5 ml. volume of Isovue-250 was injected and a partial facet joint arthrogram was obtained. A single spot film was obtained of the resulting arthrogram.    One to 1.25 ml of the steroid/anesthetic solution was then injected into each of the facet joints noted above.   Additional Comments:  The patient tolerated the procedure well Dressing: 2 x 2 sterile gauze and Band-Aid    Post-procedure details: Patient was observed during the procedure. Post-procedure instructions were reviewed.  Patient left the clinic in stable condition.    Clinical History: MRI LUMBAR SPINE WITHOUT CONTRAST  TECHNIQUE: Multiplanar, multisequence MR imaging of the lumbar spine was performed. No intravenous contrast was administered.  COMPARISON:  Lumbar spine radiographs 04/17/2017  FINDINGS: Segmentation:  Standard.  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 Minimal left convex lumbar spine curvature.  Vertebrae: Preserved vertebral body heights without evidence of acute fracture  or osseous lesion.  Conus medullaris and cauda equina: Conus extends to the T12-L1 level. Conus and cauda equina appear normal.  Paraspinal and other soft tissues: Unremarkable.  Disc levels:  L1-2: Negative.  L2-3: Mild disc desiccation. Mild disc bulging and mild facet hypertrophy result in mild bilateral lateral recess stenosis without significant spinal or neural foraminal stenosis.  L3-4: Disc desiccation and slight disc space narrowing. Circumferential disc bulging, congenitally short pedicles, and moderate facet and ligamentum flavum hypertrophy result in moderate spinal stenosis, moderate bilateral lateral recess stenosis, and mild bilateral neural foraminal stenosis. The L4 nerve roots could be  affected in the lateral recesses. Interspinous bursa formation.  L4-5: Mild disc bulging, congenitally short pedicles, and moderate facet and ligamentum flavum hypertrophy result in mild left greater than right lateral recess stenosis and mild left greater than right neural foraminal stenosis. No significant spinal stenosis pre  L5-S1: Complete disc space height loss. Anterolisthesis with disc uncovering and disc space narrowing result in mild bilateral neural foraminal stenosis without spinal stenosis.  IMPRESSION: 1. Multilevel lumbar disc and facet degeneration, most notable at L4-5 where there is moderate multifactorial spinal and lateral recess stenosis. 2. 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 3. Mild lateral recess stenosis at L2-3 and L4-5.   Electronically Signed   By: Logan Bores M.D.   On: 07/26/2017 16:14   He reports that he has never smoked. He has never used smokeless tobacco. No results for input(s): HGBA1C, LABURIC in the last 8760 hours.  Objective:  VS:  HT:     WT:    BMI:      BP:(!) 150/75   HR:(!) 59bpm   TEMP:(!) 97.5 F (36.4 C)(Oral)   RESP:  Physical Exam Vitals signs and nursing note reviewed.  Constitutional:      General: He is not in acute distress.    Appearance: Normal appearance. He is well-developed.  HENT:     Head: Normocephalic and atraumatic.  Eyes:     Conjunctiva/sclera: Conjunctivae normal.     Pupils: Pupils are equal, round, and reactive to light.  Neck:     Musculoskeletal: Normal range of motion and neck supple. No neck rigidity.  Cardiovascular:     Rate and Rhythm: Normal rate.     Pulses: Normal pulses.     Heart sounds: Normal heart sounds.  Pulmonary:     Effort: Pulmonary effort is normal. No respiratory distress.  Musculoskeletal:     Right lower leg: No edema.     Left lower leg: No edema.     Comments: Pain going from sit to stand and facet joint loading and  extension of the lumbar spine.  No pain over the greater trochanters no pain with hip rotation.  Good distal strength without clonus.  Skin:    General: Skin is warm and dry.     Findings: No erythema or rash.  Neurological:     General: No focal deficit present.     Mental Status: He is alert and oriented to person, place, and time.     Sensory: No sensory deficit.     Coordination: Coordination normal.     Gait: Gait normal.  Psychiatric:        Mood and Affect: Mood normal.        Behavior: Behavior normal.     Ortho Exam Imaging: No results found.  Past Medical/Family/Surgical/Social History: Medications & Allergies reviewed per EMR, new medications updated. Patient Active  Problem List   Diagnosis Date Noted   Mild intermittent asthma without complication 37/62/8315   Seasonal allergic rhinitis due to pollen 10/30/2017   Arthritis of knee 09/24/2017   Allergy with anaphylaxis due to food, subsequent encounter 02/16/2015   Allergic rhinitis 03/20/2011   Mild persistent asthma 03/20/2011   Past Medical History:  Diagnosis Date   Anxiety    Asthma    Cancer ([place])    SKIN   Coronary artery disease    GERD (gastroesophageal reflux disease)    Headache    High triglycerides    Myocardial infarction [facility])    Pneumonia    2010   History reviewed. No pertinent family history. Past Surgical History:  Procedure Laterality Date   APPENDECTOMY     CHOLECYSTECTOMY     COLON SURGERY     CORONARY ANGIOPLASTY WITH STENT PLACEMENT     PARTIAL KNEE ARTHROPLASTY Right 09/24/2017   Procedure: RIGHT UNICOMPARTMENTAL KNEE;  Surgeon: Meredith Pel, MD;  Location: [facility];  Service: Orthopedics;  Laterality: Right;   TONSILLECTOMY     Social History   Occupational History   Not on file  Tobacco Use   Smoking status: Never Smoker   Smokeless tobacco: Never Used  Substance and Sexual Activity   Alcohol use: Yes    Comment: monthly   Drug use: No     Sexual activity: Not on file

## 2018-09-15 NOTE — Procedures (Signed)
Lumbosacral Transforaminal Epidural Steroid Injection - Sub-Pedicular Approach with Fluoroscopic Guidance  Patient: Isaac [name]      Date of Birth: 1946-05-27 MRN: [id number] PCP: Nicola Girt, DO      Visit Date: 07/21/2018   Universal Protocol:    Date/Time: 07/21/2018  Consent Given By: the patient  Position: PRONE  Additional Comments: Vital signs were monitored before and after the procedure. Patient was prepped and draped in the usual sterile fashion. The correct patient, procedure, and site was verified.   Injection Procedure Details:  Procedure Site One Meds Administered:  Meds ordered this encounter  Medications  . betamethasone acetate-betamethasone sodium phosphate (CELESTONE) injection 12 mg    Laterality: Bilateral  Location/Site:  L3-L4  Needle size: 22 G  Needle type: Spinal  Needle Placement: Transforaminal  Findings:    -Comments: Excellent flow of contrast along the nerve and into the epidural space.  Procedure Details: After squaring off the end-plates to get a true AP view, the C-arm was positioned so that an oblique view of the foramen as noted above was visualized. The target area is just inferior to the "nose of the scotty dog" or sub pedicular. The soft tissues overlying this structure were infiltrated with 2-3 ml. of 1% Lidocaine without Epinephrine.  The spinal needle was inserted toward the target using a "trajectory" view along the fluoroscope beam.  Under AP and lateral visualization, the needle was advanced so it did not puncture dura and was located close the 6 O'Clock position of the pedical in AP tracterory. Biplanar projections were used to confirm position. Aspiration was confirmed to be negative for CSF and/or blood. A 1-2 ml. volume of Isovue-250 was injected and flow of contrast was noted at each level. Radiographs were obtained for documentation purposes.   After attaining the desired flow of contrast documented above, a 0.5 to  1.0 ml test dose of 0.25% Marcaine was injected into each respective transforaminal space.  The patient was observed for 90 seconds post injection.  After no sensory deficits were reported, and normal lower extremity motor function was noted,   the above injectate was administered so that equal amounts of the injectate were placed at each foramen (level) into the transforaminal epidural space.   Additional Comments:  The patient tolerated the procedure well Dressing: 2 x 2 sterile gauze and Band-Aid    Post-procedure details: Patient was observed during the procedure. Post-procedure instructions were reviewed.  Patient left the clinic in stable condition.

## 2018-10-29 ENCOUNTER — Encounter: Payer: Self-pay | Admitting: Podiatry

## 2018-10-29 ENCOUNTER — Ambulatory Visit (INDEPENDENT_AMBULATORY_CARE_PROVIDER_SITE_OTHER): Payer: Medicare Other | Admitting: Podiatry

## 2018-10-29 ENCOUNTER — Other Ambulatory Visit: Payer: Self-pay

## 2018-10-29 VITALS — BP 132/76 | HR 67 | Temp 97.5°F | Resp 16

## 2018-10-29 DIAGNOSIS — L600 Ingrowing nail: Secondary | ICD-10-CM | POA: Diagnosis not present

## 2018-10-29 MED ORDER — NEOMYCIN-POLYMYXIN-HC 1 % OT SOLN: OTIC | 1 refills | Status: AC

## 2018-10-29 NOTE — Patient Instructions (Signed)

## 2018-10-30 ENCOUNTER — Ambulatory Visit: Payer: Medicare Other | Admitting: Allergy & Immunology

## 2018-10-30 ENCOUNTER — Ambulatory Visit (INDEPENDENT_AMBULATORY_CARE_PROVIDER_SITE_OTHER): Payer: Medicare Other | Admitting: Allergy

## 2018-10-30 ENCOUNTER — Encounter: Payer: Self-pay | Admitting: Allergy

## 2018-10-30 VITALS — BP 114/68 | HR 71 | Temp 97.8°F | Resp 20

## 2018-10-30 DIAGNOSIS — J4520 Mild intermittent asthma, uncomplicated: Secondary | ICD-10-CM

## 2018-10-30 DIAGNOSIS — J301 Allergic rhinitis due to pollen: Secondary | ICD-10-CM

## 2018-10-30 DIAGNOSIS — J3089 Other allergic rhinitis: Secondary | ICD-10-CM

## 2018-10-30 DIAGNOSIS — J302 Other seasonal allergic rhinitis: Secondary | ICD-10-CM

## 2018-10-30 DIAGNOSIS — T7800XD Anaphylactic reaction due to unspecified food, subsequent encounter: Secondary | ICD-10-CM

## 2018-10-30 MED ORDER — IPRATROPIUM BROMIDE 0.06 % NA SOLN: NASAL | 3 refills | Status: AC

## 2018-10-30 NOTE — Progress Notes (Signed)
Follow Up Note  RE: Isaac [name] MRN: [id number] DOB: Dec 06, 1945 Date of Office Visit: 10/30/2018  Referring provider: Nicola Girt, DO Primary care provider: Nicola Girt, DO  Chief Complaint: Asthma  History of Present Illness: I had the pleasure of seeing Isaac [name] for a follow up visit at the [facility] on 10/30/2018. He is a 73 y.o. male, who is being followed for allergic rhinitis, asthma and food allergy. Today he is here for regular follow up visit. His previous allergy office visit was on 10/30/2017 with Dr. Ernst Bowler.   1. Allergic rhinitis - finished five years of allergy shots Patient completed allergy immunotherapy about 3 years ago.  No increase in symptoms since cessation. Takes Claritin 10mg  daily in the morning for the past 2 years with good benefit. Using Atrovent 1 spray daily with good benefit. No nosebleeds.   2. Mild persistent asthma, uncomplicated Denies any SOB, coughing, wheezing, chest tightness, nocturnal awakenings, ER/urgent care visits or prednisone use since the last visit.  No albuterol use since the last visit. Patient has been exercising less since the COVID-19 pandemic as he can't go swimming. Walking/running is slightly difficult due to his back pain issues.   3. Allergy with anaphylaxis due to food Currently avoiding watermelon and no accidental ingestion. Declines Epipen.   Assessment and Plan: Isaac [name] is a 73 y.o. male with: Mild intermittent asthma without complication Well controlled and did not have to use albuterol. He has been working out less lately due to the COVID-19 pandemic.   Today's spirometry showed some mild restriction.  . Daily controller medication(s): None . Prior to physical activity: May use albuterol rescue inhaler 2 puffs 5 to 15 minutes prior to strenuous physical activities. Marland Kitchen Rescue medications: May use albuterol rescue inhaler 2 puffs or nebulizer every 4 to 6 hours as needed for  shortness of breath, chest tightness, coughing, and wheezing. Monitor frequency of use.  . During upper respiratory infections: Start Qvar 32mcg 2 puffs twice a day for 1-2 weeks and rinse mouth afterwards.  Seasonal allergic rhinitis due to pollen Past history - Completed 5 years of immunotherapy to W-M-Dm and G-T. Interim history - Symptoms well controlled with below regimen.  Continue with Atrovent 1-2 sprays every 8 hours as needed for runny nose.   Continue with Claritin daily as needed.  Allergy with anaphylaxis due to food, subsequent encounter Past history - reaction to watermelon as a teen. No previous skin testing. Interim history - no accidental ingestion.  Continue to avoid trigger foods (watermelon).  Patient declined an EpiPen. Call us if you want to have an EpiPen.  For mild symptoms you can take over the counter antihistamines such as Benadryl and monitor symptoms closely. If symptoms worsen or if you have severe symptoms including breathing issues, throat closure, significant swelling, whole body hives, severe diarrhea and vomiting, lightheadedness then seek immediate medical care.  Offered to do skin testing in future but declines as well.    Return in about 1 year (around 10/30/2019).  Meds ordered this encounter  Medications  . ipratropium (ATROVENT) 0.06 % nasal spray    Sig: Take 1-2 sprays in each nostril every 8 hours as needed for runny nose.    Dispense:  45 mL    Refill:  3   Diagnostics: Spirometry:  Tracings reviewed. His effort: Good reproducible efforts. FVC: 2.90L FEV1: 2.09L, 72% predicted FEV1/FVC ratio: 72% Interpretation: Spirometry consistent with possible restrictive disease.  Please see  scanned spirometry results for details.  Medication List:  Current Outpatient Medications  Medication Sig Dispense Refill  . acetaminophen (TYLENOL) 500 MG tablet Take 500-1,000 mg by mouth every 8 (eight) hours as needed (for pain.).    Marland Kitchen Ascorbic  Acid (VITAMIN C) 1000 MG tablet Take 1,000 mg by mouth daily.     Marland Kitchen aspirin EC 81 MG tablet Take 81 mg by mouth at bedtime.    Marland Kitchen atorvastatin (LIPITOR) 40 MG tablet Take 20 mg by mouth every evening.    . Choline Fenofibrate (TRILIPIX) 135 MG capsule Take 135 mg by mouth every evening.    . Coenzyme Q10 (COQ10) 200 MG CAPS Take 200 mg by mouth at bedtime.    Marland Kitchen ipratropium (ATROVENT) 0.06 % nasal spray Take 1-2 sprays in each nostril every 8 hours as needed for runny nose. 45 mL 3  . loratadine (CLARITIN) 10 MG tablet Take 10 mg by mouth daily.    . metoprolol succinate (TOPROL-XL) 50 MG 24 hr tablet Take 25-50 mg by mouth 2 (two) times daily. Take 1 tablet (50 mg) in the moring & 0.5 tablet (25 mg) at night. Take with or immediately following a meal.    . Multiple Vitamin (MULTIVITAMIN WITH MINERALS) TABS tablet Take 1 tablet by mouth daily.    . NEOMYCIN-POLYMYXIN-HYDROCORTISONE (CORTISPORIN) 1 % SOLN OTIC solution Apply 1-2 drops to toe BID after soaking 10 mL 1  . PARoxetine (PAXIL) 20 MG tablet Take 20 mg by mouth daily.    . vitamin B-12 (CYANOCOBALAMIN) 500 MCG tablet Take 500 mcg by mouth daily.    Marland Kitchen albuterol (PROAIR HFA) 108 (90 Base) MCG/ACT inhaler Inhale 2 puffs into the lungs every 4 (four) hours as needed for wheezing or shortness of breath. (Patient not taking: Reported on 10/30/2018) 3 Inhaler 1  . ALPRAZolam (XANAX) 0.25 MG tablet Take 0.25 mg by mouth daily as needed (for anxiety attacks).     . Probiotic Product (ALIGN) 4 MG CAPS Take 4 mg by mouth at bedtime.     No current facility-administered medications for this visit.    Allergies: Allergies  Allergen Reactions  . Other     Fruits/pears/green peppers - diarrhea   . Watermelon [Citrullus Vulgaris] Swelling    Throat swelling  . Penicillins Rash    Has patient had a PCN reaction causing immediate rash, facial/tongue/throat swelling, SOB or lightheadedness with hypotension: No Has patient had a PCN reaction causing  severe rash involving mucus membranes or skin necrosis: No Has patient had a PCN reaction that required hospitalization: No Has patient had a PCN reaction occurring within the last 10 years: No If all of the above answers are "NO", then may proceed with Cephalosporin use.    I reviewed his past medical history, social history, family history, and environmental history and no significant changes have been reported from previous visit on 10/30/2017.  Review of Systems  Constitutional: Negative for appetite change, chills, fever and unexpected weight change.  HENT: Positive for rhinorrhea. Negative for congestion.   Eyes: Negative for itching.  Respiratory: Negative for cough, chest tightness, shortness of breath and wheezing.   Gastrointestinal: Negative for abdominal pain.  Skin: Negative for rash.  Allergic/Immunologic: Positive for environmental allergies and food allergies.  Neurological: Negative for headaches.   Objective: BP 114/68   Pulse 71   Temp 97.8 F (36.6 C) (Oral)   Resp 20   SpO2 97%  There is no height or weight on file to calculate  BMI. Physical Exam  Constitutional: He is oriented to person, place, and time. He appears well-developed and well-nourished.  HENT:  Head: Normocephalic and atraumatic.  Right Ear: External ear normal.  Left Ear: External ear normal.  Nose: Nose normal.  Mouth/Throat: Oropharynx is clear and moist.  Eyes: Conjunctivae and EOM are normal.  Neck: Neck supple.  Cardiovascular: Normal rate, regular rhythm and normal heart sounds. Exam reveals no gallop and no friction rub.  No murmur heard. Pulmonary/Chest: Effort normal and breath sounds normal. He has no wheezes. He has no rales.  Neurological: He is alert and oriented to person, place, and time.  Skin: Skin is warm. No rash noted.  Psychiatric: He has a normal mood and affect. His behavior is normal.  Nursing note and vitals reviewed.  Previous notes and tests were reviewed. The  plan was reviewed with the patient/family, and all questions/concerned were addressed.  It was my pleasure to see Isaac [name] today and participate in his care. Please feel free to contact me with any questions or concerns.  Sincerely,  Rexene Alberts, DO Allergy & Immunology  [facility] office: [phone] [facility] office: [phone]

## 2018-10-30 NOTE — Assessment & Plan Note (Addendum)
Past history - Completed 5 years of immunotherapy to W-M-Dm and G-T. Interim history - Symptoms well controlled with below regimen.  Continue with Atrovent 1-2 sprays every 8 hours as needed for runny nose.   Continue with Claritin daily as needed.

## 2018-10-30 NOTE — Patient Instructions (Addendum)
1. Allergic rhinitis - finished five years of allergy shots  Continue with Atrovent 1-2 sprays every 8 hours as needed for runny nose.   Continue with Claritin daily as needed. If having issues with urine flow let us know and will switch to a non-antihistamine medication.   2. Asthma . Daily controller medication(s): None . Prior to physical activity: May use albuterol rescue inhaler 2 puffs 5 to 15 minutes prior to strenuous physical activities. Marland Kitchen Rescue medications: May use albuterol rescue inhaler 2 puffs or nebulizer every 4 to 6 hours as needed for shortness of breath, chest tightness, coughing, and wheezing. Monitor frequency of use.  . During upper respiratory infections: Start Qvar 13mcg 2 puffs twice a day for 1-2 weeks and rinse mouth afterwards. . Asthma control goals:  o Full participation in all desired activities (may need albuterol before activity) o Albuterol use two times or less a week on average (not counting use with activity) o Cough interfering with sleep two times or less a month o Oral steroids no more than once a year o No hospitalizations  3. Allergy with anaphylaxis due to food  Continue to avoid trigger foods (watermelon).  Patient declined an EpiPen. Call us if you want to have an EpiPen.  For mild symptoms you can take over the counter antihistamines such as Benadryl and monitor symptoms closely. If symptoms worsen or if you have severe symptoms including breathing issues, throat closure, significant swelling, whole body hives, severe diarrhea and vomiting, lightheadedness then seek immediate medical care.  Follow up in 1 year

## 2018-10-30 NOTE — Assessment & Plan Note (Signed)
Well controlled and did not have to use albuterol. He has been working out less lately due to the COVID-19 pandemic.   Today's spirometry showed some mild restriction.  . Daily controller medication(s): None . Prior to physical activity: May use albuterol rescue inhaler 2 puffs 5 to 15 minutes prior to strenuous physical activities. Marland Kitchen Rescue medications: May use albuterol rescue inhaler 2 puffs or nebulizer every 4 to 6 hours as needed for shortness of breath, chest tightness, coughing, and wheezing. Monitor frequency of use.  . During upper respiratory infections: Start Qvar 9mcg 2 puffs twice a day for 1-2 weeks and rinse mouth afterwards.

## 2018-10-30 NOTE — Assessment & Plan Note (Signed)
Past history - reaction to watermelon as a teen. No previous skin testing. Interim history - no accidental ingestion.  Continue to avoid trigger foods (watermelon).  Patient declined an EpiPen. Call us if you want to have an EpiPen.  For mild symptoms you can take over the counter antihistamines such as Benadryl and monitor symptoms closely. If symptoms worsen or if you have severe symptoms including breathing issues, throat closure, significant swelling, whole body hives, severe diarrhea and vomiting, lightheadedness then seek immediate medical care.  Offered to do skin testing in future but declines as well.

## 2018-10-31 ENCOUNTER — Encounter: Payer: Self-pay | Admitting: Podiatry

## 2018-10-31 NOTE — Progress Notes (Signed)
Subjective:  Patient ID: Isaac [name], male    DOB: 1945-10-12,  MRN: [id number] HPI Chief Complaint  Patient presents with  . Toe Pain    Hallux left - lateral border (medial border at times), tender intermittently over several months, redness, noticed over the years 1st and 2nd toes have moved closer together possibly causing the issue, no pain other than at the nail, also wears orthotics and may need some new ones  . New Patient (Initial Visit)    73 y.o. male presents with the above complaint.   ROS: Denies fever chills nausea vomiting muscle aches pains calf pain back pain chest pain shortness of breath headache.  Past Medical History:  Diagnosis Date  . Anxiety   . Asthma   . Cancer ([place])    SKIN  . Coronary artery disease   . GERD (gastroesophageal reflux disease)   . Headache   . High triglycerides   . Myocardial infarction ([place])   . Pneumonia    2010   Past Surgical History:  Procedure Laterality Date  . APPENDECTOMY    . CHOLECYSTECTOMY    . COLON SURGERY    . CORONARY ANGIOPLASTY WITH STENT PLACEMENT    . PARTIAL KNEE ARTHROPLASTY Right 09/24/2017   Procedure: RIGHT UNICOMPARTMENTAL KNEE;  Surgeon: Meredith Pel, MD;  Location: [facility];  Service: Orthopedics;  Laterality: Right;  . TOE SURGERY  2020  . TONSILLECTOMY      Current Outpatient Medications:  .  acetaminophen (TYLENOL) 500 MG tablet, Take 500-1,000 mg by mouth every 8 (eight) hours as needed (for pain.)., Disp: , Rfl:  .  albuterol (PROAIR HFA) 108 (90 Base) MCG/ACT inhaler, Inhale 2 puffs into the lungs every 4 (four) hours as needed for wheezing or shortness of breath. (Patient not taking: Reported on 10/30/2018), Disp: 3 Inhaler, Rfl: 1 .  ALPRAZolam (XANAX) 0.25 MG tablet, Take 0.25 mg by mouth daily as needed (for anxiety attacks). , Disp: , Rfl:  .  Ascorbic Acid (VITAMIN C) 1000 MG tablet, Take 1,000 mg by mouth daily. , Disp: , Rfl:  .  aspirin EC 81 MG tablet, Take 81 mg by mouth at bedtime.,  Disp: , Rfl:  .  atorvastatin (LIPITOR) 40 MG tablet, Take 20 mg by mouth every evening., Disp: , Rfl:  .  Choline Fenofibrate (TRILIPIX) 135 MG capsule, Take 135 mg by mouth every evening., Disp: , Rfl:  .  Coenzyme Q10 (COQ10) 200 MG CAPS, Take 200 mg by mouth at bedtime., Disp: , Rfl:  .  ipratropium (ATROVENT) 0.06 % nasal spray, Take 1-2 sprays in each nostril every 8 hours as needed for runny nose., Disp: 45 mL, Rfl: 3 .  loratadine (CLARITIN) 10 MG tablet, Take 10 mg by mouth daily., Disp: , Rfl:  .  metoprolol succinate (TOPROL-XL) 50 MG 24 hr tablet, Take 25-50 mg by mouth 2 (two) times daily. Take 1 tablet (50 mg) in the moring & 0.5 tablet (25 mg) at night. Take with or immediately following a meal., Disp: , Rfl:  .  Multiple Vitamin (MULTIVITAMIN WITH MINERALS) TABS tablet, Take 1 tablet by mouth daily., Disp: , Rfl:  .  NEOMYCIN-POLYMYXIN-HYDROCORTISONE (CORTISPORIN) 1 % SOLN OTIC solution, Apply 1-2 drops to toe BID after soaking, Disp: 10 mL, Rfl: 1 .  PARoxetine (PAXIL) 20 MG tablet, Take 20 mg by mouth daily., Disp: , Rfl:  .  Probiotic Product (ALIGN) 4 MG CAPS, Take 4 mg by mouth at bedtime., Disp: , Rfl:  .  vitamin B-12 (CYANOCOBALAMIN) 500 MCG tablet, Take 500 mcg by mouth daily., Disp: , Rfl:   Allergies  Allergen Reactions  . Other     Fruits/pears/green peppers - diarrhea   . Watermelon [Citrullus Vulgaris] Swelling    Throat swelling  . Penicillins Rash    Has patient had a PCN reaction causing immediate rash, facial/tongue/throat swelling, SOB or lightheadedness with hypotension: No Has patient had a PCN reaction causing severe rash involving mucus membranes or skin necrosis: No Has patient had a PCN reaction that required hospitalization: No Has patient had a PCN reaction occurring within the last 10 years: No If all of the above answers are "NO", then may proceed with Cephalosporin use.    Review of Systems Objective:   Vitals:   10/29/18 1332  BP: 132/76   Pulse: 67  Resp: 16  Temp: (!) 97.5 F (36.4 C)    General: Well developed, nourished, in no acute distress, alert and oriented x3   Dermatological: Skin is warm, dry and supple bilateral. Nails x 10 are well maintained; remaining integument appears unremarkable at this time. There are no open sores, no preulcerative lesions, no rash or signs of infection present.  Vascular: Dorsalis Pedis artery and Posterior Tibial artery pedal pulses are 2/4 bilateral with immedate capillary fill time. Pedal hair growth present. No varicosities and no lower extremity edema present bilateral.   Neruologic: Grossly intact via light touch bilateral. Vibratory intact via tuning fork bilateral. Protective threshold with Semmes Wienstein monofilament intact to all pedal sites bilateral. Patellar and Achilles deep tendon reflexes 2+ bilateral. No Babinski or clonus noted bilateral.   Musculoskeletal: No gross boney pedal deformities bilateral. No pain, crepitus, or limitation noted with foot and ankle range of motion bilateral. Muscular strength 5/5 in all groups tested bilateral.  Gait: Unassisted, Nonantalgic.    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  Tolerated procedure well without complications.  Was provided with both oral and written home-going instruction for the care and soaking of the toe as well as a prescription for Cortisporin Otic to be applied twice daily after soaking.  We will follow-up with him in 2 weeks he will call with questions or concerns.     Jenayah Antu T. Cow[place], [place]

## 2018-11-12 ENCOUNTER — Ambulatory Visit: Payer: Medicare Other | Admitting: Podiatry

## 2019-09-21 NOTE — Telephone Encounter (Signed)
disregard
